# Patient Record
Sex: Female | Born: 1970 | Race: White | Hispanic: Yes | Marital: Single | Smoking: Never smoker
Health system: Southern US, Community
[De-identification: ages and names within clinical notes are randomized; demographics above are authoritative.]

## PROBLEM LIST (undated history)

## (undated) DIAGNOSIS — K219 Gastro-esophageal reflux disease without esophagitis: Secondary | ICD-10-CM

## (undated) DIAGNOSIS — J45909 Unspecified asthma, uncomplicated: Secondary | ICD-10-CM

## (undated) HISTORY — PX: KNEE SURGERY: SHX244

## (undated) HISTORY — DX: Gastro-esophageal reflux disease without esophagitis: K21.9

## (undated) HISTORY — DX: Unspecified asthma, uncomplicated: J45.909

## (undated) HISTORY — PX: OTHER SURGICAL HISTORY: SHX169

---

## 1997-06-07 ENCOUNTER — Other Ambulatory Visit: Admission: RE | Admit: 1997-06-07 | Discharge: 1997-06-07 | Payer: Self-pay | Admitting: *Deleted

## 2004-07-22 ENCOUNTER — Emergency Department (HOSPITAL_COMMUNITY): Admission: EM | Admit: 2004-07-22 | Discharge: 2004-07-22 | Payer: Self-pay | Admitting: Emergency Medicine

## 2005-07-09 ENCOUNTER — Ambulatory Visit: Payer: Self-pay | Admitting: Gynecology

## 2005-07-09 ENCOUNTER — Encounter (INDEPENDENT_AMBULATORY_CARE_PROVIDER_SITE_OTHER): Payer: Self-pay | Admitting: Gynecology

## 2005-08-27 ENCOUNTER — Ambulatory Visit: Payer: Self-pay | Admitting: Obstetrics and Gynecology

## 2005-10-13 ENCOUNTER — Ambulatory Visit (HOSPITAL_COMMUNITY): Admission: RE | Admit: 2005-10-13 | Discharge: 2005-10-13 | Payer: Self-pay | Admitting: Family Medicine

## 2005-10-13 ENCOUNTER — Ambulatory Visit: Payer: Self-pay | Admitting: Family Medicine

## 2005-10-30 ENCOUNTER — Ambulatory Visit: Payer: Self-pay | Admitting: Family Medicine

## 2005-11-04 ENCOUNTER — Ambulatory Visit (HOSPITAL_COMMUNITY): Admission: RE | Admit: 2005-11-04 | Discharge: 2005-11-04 | Payer: Self-pay | Admitting: Sports Medicine

## 2006-05-20 ENCOUNTER — Telehealth: Payer: Self-pay | Admitting: *Deleted

## 2006-05-20 ENCOUNTER — Encounter (INDEPENDENT_AMBULATORY_CARE_PROVIDER_SITE_OTHER): Payer: Self-pay | Admitting: Family Medicine

## 2006-05-20 ENCOUNTER — Ambulatory Visit: Payer: Self-pay | Admitting: Family Medicine

## 2006-05-20 LAB — CONVERTED CEMR LAB: Rapid Strep: NEGATIVE

## 2006-06-30 ENCOUNTER — Ambulatory Visit: Payer: Self-pay | Admitting: Family Medicine

## 2006-08-16 ENCOUNTER — Ambulatory Visit: Payer: Self-pay | Admitting: Sports Medicine

## 2006-09-17 ENCOUNTER — Ambulatory Visit: Payer: Self-pay | Admitting: Family Medicine

## 2006-10-20 ENCOUNTER — Encounter (INDEPENDENT_AMBULATORY_CARE_PROVIDER_SITE_OTHER): Payer: Self-pay | Admitting: Family Medicine

## 2006-10-20 ENCOUNTER — Ambulatory Visit: Payer: Self-pay | Admitting: Family Medicine

## 2006-10-20 DIAGNOSIS — K219 Gastro-esophageal reflux disease without esophagitis: Secondary | ICD-10-CM | POA: Insufficient documentation

## 2006-10-27 ENCOUNTER — Encounter (INDEPENDENT_AMBULATORY_CARE_PROVIDER_SITE_OTHER): Payer: Self-pay | Admitting: Family Medicine

## 2006-10-27 LAB — CONVERTED CEMR LAB: Pap Smear: NORMAL

## 2006-10-29 ENCOUNTER — Encounter (INDEPENDENT_AMBULATORY_CARE_PROVIDER_SITE_OTHER): Payer: Self-pay | Admitting: Family Medicine

## 2006-11-03 ENCOUNTER — Ambulatory Visit: Payer: Self-pay | Admitting: Family Medicine

## 2006-11-04 ENCOUNTER — Encounter (INDEPENDENT_AMBULATORY_CARE_PROVIDER_SITE_OTHER): Payer: Self-pay | Admitting: *Deleted

## 2006-11-16 ENCOUNTER — Encounter (INDEPENDENT_AMBULATORY_CARE_PROVIDER_SITE_OTHER): Payer: Self-pay | Admitting: Family Medicine

## 2006-11-16 ENCOUNTER — Ambulatory Visit: Payer: Self-pay | Admitting: Family Medicine

## 2006-11-16 LAB — CONVERTED CEMR LAB
Total CHOL/HDL Ratio: 3.5
Triglycerides: 91 mg/dL (ref <150)

## 2006-11-22 ENCOUNTER — Ambulatory Visit: Payer: Self-pay | Admitting: Family Medicine

## 2006-11-22 DIAGNOSIS — J45909 Unspecified asthma, uncomplicated: Secondary | ICD-10-CM | POA: Insufficient documentation

## 2007-01-13 ENCOUNTER — Ambulatory Visit: Payer: Self-pay | Admitting: Family Medicine

## 2007-01-17 ENCOUNTER — Ambulatory Visit: Payer: Self-pay | Admitting: Family Medicine

## 2007-04-12 ENCOUNTER — Ambulatory Visit: Payer: Self-pay | Admitting: Family Medicine

## 2007-04-12 ENCOUNTER — Encounter (INDEPENDENT_AMBULATORY_CARE_PROVIDER_SITE_OTHER): Payer: Self-pay | Admitting: *Deleted

## 2007-07-27 ENCOUNTER — Telehealth: Payer: Self-pay | Admitting: *Deleted

## 2007-07-29 ENCOUNTER — Ambulatory Visit: Payer: Self-pay | Admitting: Family Medicine

## 2007-07-29 ENCOUNTER — Telehealth: Payer: Self-pay | Admitting: *Deleted

## 2007-09-01 ENCOUNTER — Ambulatory Visit: Payer: Self-pay | Admitting: Family Medicine

## 2007-09-01 ENCOUNTER — Encounter: Payer: Self-pay | Admitting: Family Medicine

## 2007-09-01 LAB — CONVERTED CEMR LAB
ALT: 21 units/L (ref 0–35)
AST: 17 units/L (ref 0–37)
BUN: 10 mg/dL (ref 6–23)
Basophils Absolute: 0 10*3/uL (ref 0.0–0.1)
Basophils Relative: 1 % (ref 0–1)
Chloride: 105 meq/L (ref 96–112)
Creatinine, Ser: 0.6 mg/dL (ref 0.40–1.20)
Eosinophils Absolute: 0.2 10*3/uL (ref 0.0–0.7)
Eosinophils Relative: 4 % (ref 0–5)
Glucose, Bld: 106 mg/dL — ABNORMAL HIGH (ref 70–99)
Lymphocytes Relative: 40 % (ref 12–46)
MCV: 90.3 fL (ref 78.0–100.0)
Monocytes Absolute: 0.5 10*3/uL (ref 0.1–1.0)
Potassium: 3.9 meq/L (ref 3.5–5.3)
RBC: 4.34 M/uL (ref 3.87–5.11)
Sed Rate: 15 mm/hr (ref 0–22)
Sodium: 141 meq/L (ref 135–145)
TSH: 1.969 microintl units/mL (ref 0.350–4.50)
Total Bilirubin: 0.3 mg/dL (ref 0.3–1.2)
WBC: 5.8 10*3/uL (ref 4.0–10.5)

## 2007-09-28 ENCOUNTER — Ambulatory Visit: Payer: Self-pay | Admitting: Family Medicine

## 2007-10-07 ENCOUNTER — Encounter: Payer: Self-pay | Admitting: Family Medicine

## 2007-11-15 ENCOUNTER — Ambulatory Visit: Payer: Self-pay | Admitting: Family Medicine

## 2007-11-15 ENCOUNTER — Telehealth (INDEPENDENT_AMBULATORY_CARE_PROVIDER_SITE_OTHER): Payer: Self-pay | Admitting: *Deleted

## 2007-11-17 ENCOUNTER — Telehealth: Payer: Self-pay | Admitting: *Deleted

## 2007-11-30 ENCOUNTER — Ambulatory Visit: Payer: Self-pay | Admitting: Family Medicine

## 2008-02-17 ENCOUNTER — Ambulatory Visit: Payer: Self-pay | Admitting: Family Medicine

## 2008-02-17 DIAGNOSIS — G4700 Insomnia, unspecified: Secondary | ICD-10-CM | POA: Insufficient documentation

## 2008-07-27 ENCOUNTER — Ambulatory Visit: Payer: Self-pay | Admitting: Family Medicine

## 2008-08-27 ENCOUNTER — Emergency Department (HOSPITAL_COMMUNITY): Admission: EM | Admit: 2008-08-27 | Discharge: 2008-08-27 | Payer: Self-pay | Admitting: Emergency Medicine

## 2008-10-05 ENCOUNTER — Emergency Department (HOSPITAL_COMMUNITY): Admission: EM | Admit: 2008-10-05 | Discharge: 2008-10-05 | Payer: Self-pay | Admitting: Family Medicine

## 2008-10-26 ENCOUNTER — Encounter: Payer: Self-pay | Admitting: Family Medicine

## 2008-10-26 ENCOUNTER — Ambulatory Visit: Payer: Self-pay | Admitting: Family Medicine

## 2008-10-29 ENCOUNTER — Ambulatory Visit: Payer: Self-pay | Admitting: Family Medicine

## 2008-11-12 ENCOUNTER — Emergency Department (HOSPITAL_COMMUNITY): Admission: EM | Admit: 2008-11-12 | Discharge: 2008-11-12 | Payer: Self-pay | Admitting: Emergency Medicine

## 2008-11-23 ENCOUNTER — Ambulatory Visit: Payer: Self-pay | Admitting: Family Medicine

## 2009-01-24 ENCOUNTER — Telehealth: Payer: Self-pay | Admitting: Family Medicine

## 2009-01-28 ENCOUNTER — Ambulatory Visit: Payer: Self-pay | Admitting: Family Medicine

## 2009-01-28 LAB — CONVERTED CEMR LAB: Beta hcg, urine, semiquantitative: NEGATIVE

## 2009-05-24 ENCOUNTER — Ambulatory Visit: Payer: Self-pay | Admitting: Family Medicine

## 2009-05-24 LAB — CONVERTED CEMR LAB: Beta hcg, urine, semiquantitative: NEGATIVE

## 2009-07-02 ENCOUNTER — Ambulatory Visit: Payer: Self-pay | Admitting: Family Medicine

## 2009-07-12 ENCOUNTER — Encounter: Payer: Self-pay | Admitting: Family Medicine

## 2009-07-12 ENCOUNTER — Ambulatory Visit: Payer: Self-pay | Admitting: Family Medicine

## 2009-08-02 ENCOUNTER — Emergency Department (HOSPITAL_COMMUNITY): Admission: EM | Admit: 2009-08-02 | Discharge: 2009-08-02 | Payer: Self-pay | Admitting: Emergency Medicine

## 2009-08-02 ENCOUNTER — Emergency Department (HOSPITAL_COMMUNITY): Admission: EM | Admit: 2009-08-02 | Discharge: 2009-08-03 | Payer: Self-pay | Admitting: Emergency Medicine

## 2009-08-23 ENCOUNTER — Ambulatory Visit: Payer: Self-pay | Admitting: Family Medicine

## 2009-08-27 ENCOUNTER — Telehealth: Payer: Self-pay | Admitting: *Deleted

## 2009-09-04 ENCOUNTER — Ambulatory Visit (HOSPITAL_COMMUNITY): Admission: RE | Admit: 2009-09-04 | Discharge: 2009-09-04 | Payer: Self-pay | Admitting: Family Medicine

## 2009-09-06 ENCOUNTER — Ambulatory Visit: Payer: Self-pay | Admitting: Family Medicine

## 2009-09-10 ENCOUNTER — Encounter: Payer: Self-pay | Admitting: Family Medicine

## 2009-09-10 ENCOUNTER — Ambulatory Visit: Payer: Self-pay | Admitting: Family Medicine

## 2009-09-11 LAB — CONVERTED CEMR LAB
ALT: 10 units/L (ref 0–35)
Albumin: 4.3 g/dL (ref 3.5–5.2)
Alkaline Phosphatase: 45 units/L (ref 39–117)
Basophils Relative: 1 % (ref 0–1)
CO2: 26 meq/L (ref 19–32)
Calcium: 8.9 mg/dL (ref 8.4–10.5)
Chloride: 104 meq/L (ref 96–112)
Cholesterol: 172 mg/dL (ref 0–200)
Eosinophils Relative: 3 % (ref 0–5)
HDL: 50 mg/dL (ref >39)
LDL Cholesterol: 110 mg/dL — ABNORMAL HIGH (ref 0–99)
MCHC: 32 g/dL (ref 30.0–36.0)
Platelets: 183 10*3/uL (ref 150–400)
Potassium: 4 meq/L (ref 3.5–5.3)
RBC: 4.3 M/uL (ref 3.87–5.11)
RDW: 13.9 % (ref 11.5–15.5)
TSH: 2.46 microintl units/mL (ref 0.350–4.500)
Total CHOL/HDL Ratio: 3.4
Total Protein: 7.3 g/dL (ref 6.0–8.3)
Triglycerides: 61 mg/dL (ref <150)
WBC: 4.4 10*3/uL (ref 4.0–10.5)

## 2009-09-27 ENCOUNTER — Ambulatory Visit: Payer: Self-pay | Admitting: Family Medicine

## 2009-09-27 DIAGNOSIS — G43009 Migraine without aura, not intractable, without status migrainosus: Secondary | ICD-10-CM | POA: Insufficient documentation

## 2009-10-11 ENCOUNTER — Telehealth: Payer: Self-pay | Admitting: *Deleted

## 2009-10-20 ENCOUNTER — Encounter: Payer: Self-pay | Admitting: Family Medicine

## 2009-10-21 ENCOUNTER — Ambulatory Visit: Payer: Self-pay | Admitting: Family Medicine

## 2009-10-24 ENCOUNTER — Ambulatory Visit (HOSPITAL_COMMUNITY): Admission: RE | Admit: 2009-10-24 | Discharge: 2009-10-24 | Payer: Self-pay | Admitting: Family Medicine

## 2009-10-25 ENCOUNTER — Telehealth: Payer: Self-pay | Admitting: Family Medicine

## 2009-10-28 ENCOUNTER — Ambulatory Visit: Payer: Self-pay | Admitting: Family Medicine

## 2009-11-19 ENCOUNTER — Ambulatory Visit: Payer: Self-pay | Admitting: Family Medicine

## 2009-11-19 DIAGNOSIS — M25569 Pain in unspecified knee: Secondary | ICD-10-CM | POA: Insufficient documentation

## 2009-12-05 ENCOUNTER — Ambulatory Visit: Payer: Self-pay | Admitting: Family Medicine

## 2009-12-05 ENCOUNTER — Encounter: Payer: Self-pay | Admitting: Family Medicine

## 2009-12-10 ENCOUNTER — Ambulatory Visit: Payer: Self-pay | Admitting: Family Medicine

## 2009-12-16 ENCOUNTER — Ambulatory Visit: Payer: Self-pay | Admitting: Family Medicine

## 2010-01-03 ENCOUNTER — Ambulatory Visit: Admission: RE | Admit: 2010-01-03 | Discharge: 2010-01-03 | Payer: Self-pay | Source: Home / Self Care

## 2010-02-04 NOTE — Assessment & Plan Note (Signed)
Summary: IUD/KH   Vital Signs:  Patient profile:   40 year old female Height:      63 inches Weight:      148 pounds BMI:     26.31 Temp:     98.0 degrees F oral Pulse rate:   70 / minute BP sitting:   115 / 73  (right arm) Cuff size:   regular  Vitals Entered By: Tessie Fass CMA (July 12, 2009 10:31 AM)  Primary Care Provider:  Ardeen Garland  MD  CC:  IUD.  History of Present Illness:   Pt here for IUD insertion  Upreg negative Has had Mirena in past  Procedure explained  Current Medications (verified): 1)  Albuterol 90 Mcg/act  Aers (Albuterol) .... 2 Puffs Q 4 Hours As Needed For Asthma 2)  Loratadine 10 Mg  Tabs (Loratadine) .... One Tab By Mouth Daily. Please Print Label in Spanish 3)  Famotidine 20 Mg  Tabs (Famotidine) .... One Tab By Mouth Daily. Please Print Label in Spanish 4)  Lactulose 10 Gm/108ml Soln (Lactulose) .... 30 Ml Daily, Qs For One Month 5)  Amitriptyline Hcl 25 Mg Tabs (Amitriptyline Hcl) .Marland Kitchen.. 1 Tablet By Mouth Qhs For Sleep Label in Spanish, Please 6)  Cyclobenzaprine Hcl 5 Mg Tabs (Cyclobenzaprine Hcl) .Marland Kitchen.. 1 Tab By Mouth Q 8 Hrs As Needed Muscle Pain or Spasm. Please Label in Spanish  Allergies (verified): 1)  ! Sprintec 28 (Norgestimate-Eth Estradiol)  Physical Exam  Genitalia:  normal introitus, no external lesions, no vaginal discharge, mucosa pink and moist, and no vaginal or cervical lesions.  uterus palpated  Additional Exam:  Procedure- risks, benefits and procedure explained, consent form obtained and signs, all questions answered Prepped with Betadine Uterus Sounded to 8 Mirena IUD inserted with ease  Strings cut to 1cm minimal blood loss pt tolerated procedure well Ibuprofen given s/p procedure Attending- Dr. McDiarmid   Impression & Recommendations:  Problem # 1:  CONTRACEPTIVE MANAGEMENT (ICD-V25.09) Assessment New Mirena, RTC 1 month for follow-up  See instructions Orders: U Preg-[facility] (09811) IUD Supply-[facility]  (B1478) IUD insert- [facility] (29562) Ibuprofen 200mg  tab [facility]) IUD insert- [facility] (13086)  Complete Medication List: 1)  Albuterol 90 Mcg/act Aers (Albuterol) .... 2 puffs q 4 hours as needed for asthma 2)  Loratadine 10 Mg Tabs (Loratadine) .... One tab by mouth daily. please print label in spanish 3)  Famotidine 20 Mg Tabs (Famotidine) .... One tab by mouth daily. please print label in spanish 4)  Lactulose 10 Gm/65ml Soln (Lactulose) .... 30 ml daily, qs for one month 5)  Amitriptyline Hcl 25 Mg Tabs (Amitriptyline hcl) .Marland Kitchen.. 1 tablet by mouth qhs for sleep label in spanish, please 6)  Cyclobenzaprine Hcl 5 Mg Tabs (Cyclobenzaprine hcl) .Marland Kitchen.. 1 tab by mouth q 8 hrs as needed muscle pain or spasm. please label in spanish  Patient Instructions: 1)  You may have some cramping, ibuprofen as needed for pain  2)  Return in 1 month for recheck on IUD  3)  Wear a pad for spotting or light bleeding  Laboratory Results   Urine Tests  Date/Time Received: July 12, 2009 10:32 AM  Date/Time Reported: July 12, 2009 10:38 AM     Urine HCG: negative Comments: ...........test performed by...........Marland KitchenTerese Door, CMA       Medication Administration  Medication # 1:    Medication: Ibuprofen 200mg  tab    Diagnosis: CONTRACEPTIVE MANAGEMENT (ICD-V25.09)    Dose: 4 tablets  Route: po    Exp Date: 10/06/2010    Lot #: [id number]    Mfr: LNK    Comments: 800mg  given (4 tabs 200mg  each)    Given by: Jimmy Footman, CMA (July 12, 2009 11:17 AM)  Orders Added: 1)  U Preg-[facility] [81025] 2)  IUD Supply-[facility] [J7302] 3)  IUD insert- [facility] [58300] 4)  Ibuprofen 200mg  tab [EMRORAL] 5)  IUD insert- [facility] [58300]

## 2010-02-04 NOTE — Assessment & Plan Note (Signed)
Summary: F/U KNEE/DR TA NOT AVAIL/KH   Vital Signs:  Patient profile:   40 year old female Height:      61.75 inches Weight:      152 pounds BMI:     28.13 Temp:     98.7 degrees F oral Pulse rate:   71 / minute BP sitting:   107 / 70  (right arm) Cuff size:   regular  Vitals Entered By: Tessie Fass CMA (October 28, 2009 8:48 AM) CC: left knee pain Pain Assessment Patient in pain? yes     Location: left knee   Primary Care Provider:  Cat Ta MD  CC:  left knee pain.  History of Present Illness:   Left knee- initially pain in one spot now radiating to the sides, feels pain is getting worse, now pain with bending knee, touching knee, or any movements, mild swelling, +pain with walking, taking Voltaren ( 2 more pills ) and Ultram does not take because it makes her sleepy , takes  1/2 tablet at night, currently wearing knee brace, still out of work . Out of work for 1 week, plans to return on Wed ,no locking or catching Told she would be sent to [facility] by prior physician  Current Medications (verified): 1)  Albuterol 90 Mcg/act  Aers (Albuterol) .... 2 Puffs Q 4 Hours As Needed For Asthma 2)  Loratadine 10 Mg  Tabs (Loratadine) .... One Tab By Mouth Daily. Please Print Label in Spanish 3)  Famotidine 20 Mg  Tabs (Famotidine) .... One Tab By Mouth Daily. Please Print Label in Spanish 4)  Lactulose 10 Gm/28ml Soln (Lactulose) .... 30 Ml Daily, Qs For One Month 5)  Amitriptyline Hcl 25 Mg Tabs (Amitriptyline Hcl) .Marland Kitchen.. 1 Tablet By Mouth Qhs For Sleep Label in Spanish, Please 6)  Imitrex 25 Mg Tabs (Sumatriptan Succinate) .Marland Kitchen.. 1 Tab By Mouth X 1 When You Have Headache.  May Repeat X 1 After 2 Hours If Headache Recurs.  Lable in Spanish. 7)  Mobic 7.5 Mg Tabs (Meloxicam) .Marland Kitchen.. 1 By Mouth Daily For Knee Inflammation 8)  Ultram 50 Mg Tabs (Tramadol Hcl) .Marland Kitchen.. 1 By Mouth Q 6hrs As Needed Pain  Allergies (verified): 1)  ! Sprintec 28 (Norgestimate-Eth Estradiol)  Physical Exam  General:   Vitals reviewed.  NAD Msk:  Left knee: Wearing Brace, No swelling or effusion noted, mild pain with varus testing, normal ROM, no crepitus, no external bruising noted. Patella tracts normally, TTP over inferior joint line and lateral joint line.   Negative lachmans.    Right knee: normal appearing.  No swelling.  normal ROM   Impression & Recommendations:  Problem # 1:  KNEE PAIN, LEFT (ICD-719.46) Assessment Deteriorated Persistant knee pain, no red flags on exam, normal x-ray, likley brusining, tendons appear intact, will continue with anti-inflammatories, Ultram for pain, refer to [facility] at [facility], brace. 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  Her updated medication list for this problem includes:    Mobic 7.5 Mg Tabs (Meloxicam) .Marland Kitchen... 1 by mouth daily for knee inflammation    Ultram 50 Mg Tabs (Tramadol hcl) .Marland Kitchen... 1 by mouth q 6hrs as needed pain  Orders: Sports Medicine (Sports Med) [facility]- Est Level  3 (32440)  Complete Medication List: 1)  Albuterol 90 Mcg/act Aers (Albuterol) .... 2 puffs q 4 hours as needed for asthma 2)  Loratadine 10 Mg Tabs (Loratadine) .... One tab by mouth daily. please print label in  spanish 3)  Famotidine 20 Mg Tabs (Famotidine) .... One tab by mouth daily. please print label in spanish 4)  Lactulose 10 Gm/84ml Soln (Lactulose) .... 30 ml daily, qs for one month 5)  Amitriptyline Hcl 25 Mg Tabs (Amitriptyline hcl) .Marland Kitchen.. 1 tablet by mouth qhs for sleep label in spanish, please 6)  Imitrex 25 Mg Tabs (Sumatriptan succinate) .Marland Kitchen.. 1 tab by mouth x 1 when you have headache.  may repeat x 1 after 2 hours if headache recurs.  lable in spanish. 7)  Mobic 7.5 Mg Tabs (Meloxicam) .Marland Kitchen.. 1 by mouth daily for knee inflammation 8)  Ultram 50 Mg Tabs (Tramadol hcl) .Marland Kitchen.. 1 by mouth q 6hrs as needed pain  Patient Instructions: 1)  For your knee we will send you to the Sports Medicine Clinic 2)  Continue to take the  anti-inflammatory, start the Mobic once a day for 3 weeks 3)  You can use the ultram as needed 4)  If you notice swelling in your knee return for re-evaluation 5)  Wear your brace while at work Prescriptions: FAMOTIDINE 20 MG  TABS (FAMOTIDINE) one tab by mouth daily. please print label in spanish  #30 x 6   Entered and Authorized by:   Milinda Antis MD   Signed by:   Milinda Antis MD on 10/28/2009   Method used:   Print then Give to Patient   RxID:   [id number] MOBIC 7.5 MG TABS (MELOXICAM) 1 by mouth daily for knee inflammation  #21 x 0   Entered and Authorized by:   Milinda Antis MD   Signed by:   Milinda Antis MD on 10/28/2009   Method used:   Print then Give to Patient   RxID:   [id number]    Orders Added: 1)  Sports Medicine [Sports Med] 2)  [facility]- Est Level  3 [16606]

## 2010-02-04 NOTE — Miscellaneous (Signed)
Summary: Asthma  Clinical Lists Changes  Problems: Removed problem of SCREENING FOR LIPOID DISORDERS (ICD-V77.91)

## 2010-02-04 NOTE — Letter (Signed)
Summary: Out of Work  [facility]  [address]   [place], [place] [address]   Phone: [phone]  Fax: [phone]    December 05, 2009   Employee:  ANIEYA HELMAN    To Whom It May Concern:   For Medical reasons, please excuse the above named employee from work for the following dates:  Start:   12/05/09  End:   12/06/09 or longer if desired.   If you need additional information, please feel free to contact our office.         Sincerely,    Clementeen Graham MD

## 2010-02-04 NOTE — Progress Notes (Signed)
Summary: re: [place] at [facility]  Phone Note Call from Patient Call back at [phone]   Caller: Sister-Mariel Summary of Call: got called into work and cannot go to [place] appt today - needs to resch (sister is coming in today at 11:15 to see Mauricio Po - she will talk to nurse about it) Initial call taken by: De Nurse,  August 27, 2009 9:32 AM  Follow-up for Phone Call        called pt. spoke with [facility].  Advised to call [facility] and cancel the appt for today, re-schedule the appt. She agreed and will do that. Follow-up by: Arlyss Repress CMA,,  August 27, 2009 9:48 AM

## 2010-02-04 NOTE — Miscellaneous (Signed)
Summary: Consent: IUD Insertion  Consent: IUD Insertion   Imported By: Knox Royalty 07/27/2009 13:30:39  _____________________________________________________________________  External Attachment:    Type:   Image     Comment:   External Document

## 2010-02-04 NOTE — Assessment & Plan Note (Signed)
Summary: preg test,df   Vital Signs:  Patient profile:   40 year old female Height:      63 inches Weight:      145 pounds BMI:     25.78 Temp:     98.5 degrees F oral Pulse rate:   72 / minute BP sitting:   106 / 67  (left arm) Cuff size:   large  Vitals Entered By: Tessie Fass CMA (May 24, 2009 9:52 AM) CC: preg test Is Patient Diabetic? No Pain Assessment Patient in pain? yes     Location: back Intensity: 9   Primary Care Provider:  Ardeen Garland  MD  CC:  preg test.  History of Present Illness: Tracey Kane comes in for pregnancy test and birth control.  Seen in January and IUD was removed as it had expired.  She completed [employer] paperwork to receive another and opted for depoprovera in the meantime.  States had 1 menses after that but then has not had one since and was worried she was pregnant.  She never heard on the IUD.  Checked with staff and she was approved for the IUD but we have not received it yet.  States she would like the pills instead to use until the IUD comes in.   Habits & Providers  Alcohol-Tobacco-Diet     Tobacco Status: never  Allergies: No Known Drug Allergies  Physical Exam  General:  well-developed, well-nourished, and well-hydrated.  vitals reviewed.  Heart:  Normal rate and regular rhythm. S1 and S2 normal without gallop, murmur, click, rub or other extra sounds. Abdomen:  Bowel sounds positive,abdomen soft and non-tender without masses, organomegaly or hernias noted.   Impression & Recommendations:  Problem # 1:  CONTRACEPTIVE MANAGEMENT (ICD-V25.09) Assessment Unchanged  Staff will check on IUD situation.  Sprintec in the meantime.  Reminded patient to schedule insertion with a provider who inserts them.   Orders: [facility]- Est Level  3 (74259)  Complete Medication List: 1)  Albuterol 90 Mcg/act Aers (Albuterol) .... 2 puffs q 4 hours as needed for asthma 2)  Loratadine 10 Mg Tabs (Loratadine) .... One tab by mouth daily. please  print label in spanish 3)  Famotidine 20 Mg Tabs (Famotidine) .... One tab by mouth daily. please print label in spanish 4)  Lactulose 10 Gm/53ml Soln (Lactulose) .... 30 ml daily, qs for one month 5)  Guiatuss Ac 100-10 Mg/71ml Syrp (Guaifenesin-codeine) 6)  Amitriptyline Hcl 25 Mg Tabs (Amitriptyline hcl) .Marland Kitchen.. 1 tablet by mouth qhs for sleep label in spanish, please 7)  Cyclobenzaprine Hcl 5 Mg Tabs (Cyclobenzaprine hcl) .Marland Kitchen.. 1 tab by mouth q 8 hrs as needed muscle pain or spasm. please label in spanish 8)  Sprintec 28 0.25-35 Mg-mcg Tabs (Norgestimate-eth estradiol) .... Take one tablet daily at same time everyday.  label in spanish. disp: 3 months  Other Orders: U Preg-[facility] (56387)  Patient Instructions: 1)  Vamos a llamarle cuando recibimos su IUD. 2)  Toma las pastillas al mismo tiempo cada dia hasta recibe el IUD.   3)  Cuando hace su cita para insertar el IUD, hace con Dr. Jeanice Lim.  Yo no inserto los IUDs.   Prescriptions: ALBUTEROL 90 MCG/ACT  AERS (ALBUTEROL) 2 puffs q 4 hours as needed for asthma  #1 x 11   Entered and Authorized by:   Ardeen Garland  MD   Signed by:   Ardeen Garland  MD on 05/24/2009   Method used:   Print then Give to Patient  RxID:   [id number] SPRINTEC 28 0.25-35 MG-MCG TABS (NORGESTIMATE-ETH ESTRADIOL) take one tablet daily at same time everyday.  Label in spanish. disp: 3 months  #3 x 3   Entered and Authorized by:   Ardeen Garland  MD   Signed by:   Ardeen Garland  MD on 05/24/2009   Method used:   Print then Give to Patient   RxID:   [id number]   Laboratory Results   Urine Tests  Date/Time Received: May 24, 2009 10:34 AM  Date/Time Reported: May 24, 2009 10:44 AM     Urine HCG: negative Comments: ...........test performed by...........Marland KitchenTerese Door, CMA

## 2010-02-04 NOTE — Assessment & Plan Note (Signed)
Summary: F/U,MC   Vital Signs:  Patient profile:   40 year old female Pulse rate:   84 / minute BP sitting:   120 / 79  (right arm)  Vitals Entered By: Rochele Pages RN (December 10, 2009 9:08 AM) CC: f/u lt knee pain   Chief Complaint:  f/u lt knee pain.  History of Present Illness: Pt reports minimal improvement in knee pain since most recent clinic visit. Steroid injection helped with pain and ROM for approx 2 days per pt (Daughter translating for pt).  Ultram has been somewhat effective for pain; usually helps with pain at night.  Has been attending physcial therapy; feels that has only increased pain and weakness. baseline wekaness and anteriolateral knee pain essentially inchanged per pt. has been compliant in wearing brace which has helped somewhat with overall stability.   Preventive Screening-Counseling & Management  Alcohol-Tobacco     Smoking Status: never  Allergies: 1)  ! Sprintec 28 (Norgestimate-Eth Estradiol)  Physical Exam  General:  alert and overweight-appearing.   Msk:  Knee: Normal to inspection with no erythema or effusion or obvious bony abnormalities. +TTP along pateller tendon  Decreased ROM and pain w/ flexion, extension and lower leg rotation. + tenderness to palpation with ant/post movement of knee joint  grossly + mcmurray's Decreased hamstring and quadriceps strength Decreased strength with hip abduction bilaterally    Impression & Recommendations:  Problem # 1:  KNEE PAIN, LEFT (ICD-719.46)   U/S non concering for patellar tendon tear.  Patellar strap given to pt for patellar support. Stressed with pt importance of PT for strenghtening of knee as to help in knee weakness and stability. Her updated medication list for this problem includes:    Ultram 50 Mg Tabs (Tramadol hcl) .Marland Kitchen... 1 by mouth q 6hrs as needed pain    Tylenol With Codeine #3 300-30 Mg Tabs (Acetaminophen-codeine) .Marland Kitchen... 1 pill by mouth at night for cough  Orders: [place] LIMITED  (65784)  Complete Medication List: 1)  Albuterol 90 Mcg/act Aers (Albuterol) .... 2 puffs q 4 hours as needed for asthma 2)  Loratadine 10 Mg Tabs (Loratadine) .... One tab by mouth daily. please print label in spanish 3)  Famotidine 20 Mg Tabs (Famotidine) .... One tab by mouth daily. please print label in spanish 4)  Lactulose 10 Gm/32ml Soln (Lactulose) .... 30 ml daily, qs for one month 5)  Amitriptyline Hcl 25 Mg Tabs (Amitriptyline hcl) .Marland Kitchen.. 1 tablet by mouth qhs for sleep label in spanish, please 6)  Imitrex 25 Mg Tabs (Sumatriptan succinate) .Marland Kitchen.. 1 tab by mouth x 1 when you have headache.  may repeat x 1 after 2 hours if headache recurs.  lable in spanish. 7)  Ultram 50 Mg Tabs (Tramadol hcl) .Marland Kitchen.. 1 by mouth q 6hrs as needed pain 8)  Azithromycin 250 Mg Tabs (Azithromycin) .... 2 pills by mouth daily and 1 pill daily days 2-5. spanish instructions 9)  Tylenol With Codeine #3 300-30 Mg Tabs (Acetaminophen-codeine) .Marland Kitchen.. 1 pill by mouth at night for cough   Orders Added: 1)  Est. Patient Level III [69629] 2)  [place] LIMITED [52841]
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
## 2010-02-04 NOTE — Assessment & Plan Note (Signed)
Summary: vag prob,df   Vital Signs:  Patient profile:   40 year old female Height:      63 inches Weight:      146 pounds BMI:     25.96 Temp:     98.0 degrees F oral Pulse rate:   81 / minute BP sitting:   113 / 65  (left arm) Cuff size:   regular  Vitals Entered By: Tessie Fass, CMA CC: check iud Is Patient Diabetic? No Pain Assessment Patient in pain? no        Primary Care Provider:  Ardeen Garland  MD  CC:  check iud.  History of Present Illness: Ms. Genia Hotter comes in today because she believes her IUD is expired.  She had it placed after the birth of her youngest child, who was born September 2005.  It is a [employer] fast streamer.  She did not have it placed here, but per her records at the time she was first seen here, it does indeed correspond to her mirena being placed in late 2005 (seen in 2007, with reference to mirena placed 2 years prior).  Endorses LMP as end of December.  Would like to have another mirena.    Habits & Providers  Alcohol-Tobacco-Diet     Tobacco Status: never  Allergies: No Known Drug Allergies  Physical Exam  General:  well-developed, well-nourished, and well-hydrated.   Abdomen:  soft, nt, nd Genitalia:  normal introitus, no external lesions, no vaginal discharge, mucosa pink and moist, and no vaginal or cervical lesions.  Strings of IUD difficult to visualize.  Pap endocervical brush used to identify/isolate strings and IUD removed with forceps without difficulty.    Impression & Recommendations:  Problem # 1:  CONTRACEPTIVE MANAGEMENT (ICD-V25.09) Assessment New Expired IUD removed.  Patient completed paperwork for Mirena IUD through [facility].  Given depo provera today for contraception while awaiting new IUD.  When IUD is in, patient will schedule appt with Dr. Jeanice Lim for insertion.  Orders: U Preg-[facility] (81025) [facility]- Est Level  3 (47829) IUD removal -[facility] (56213) Depo-Provera 150mg  (Y8657)  Complete Medication List: 1)  Albuterol 90  Mcg/act Aers (Albuterol) .... 2 puffs q 4 hours as needed for asthma 2)  Loratadine 10 Mg Tabs (Loratadine) .... One tab by mouth daily. please print label in spanish 3)  Famotidine 20 Mg Tabs (Famotidine) .... One tab by mouth daily. please print label in spanish 4)  Lactulose 10 Gm/42ml Soln (Lactulose) .... 30 ml daily, qs for one month 5)  Guiatuss Ac 100-10 Mg/36ml Syrp (Guaifenesin-codeine) 6)  Amitriptyline Hcl 25 Mg Tabs (Amitriptyline hcl) .Marland Kitchen.. 1 tablet by mouth qhs for sleep label in spanish, please 7)  Cyclobenzaprine Hcl 5 Mg Tabs (Cyclobenzaprine hcl) .Marland Kitchen.. 1 tab by mouth q 8 hrs as needed muscle pain or spasm. please label in spanish  Patient Instructions: 1)  Hemos quitado su Mirena hoy. 2)  Ud recibe la inyeccion de Depo para una anti-conceptivo. 3)  Cuando nosotros recibimos su IUD, vamos a llamarle.  Despues de esto, hace una cita con DR. KAWANTA [place] para ponerse el IUD.  4)  Si tiene preguntas, llamanos. Prescriptions: CYCLOBENZAPRINE HCL 5 MG TABS (CYCLOBENZAPRINE HCL) 1 tab by mouth q 8 hrs as needed muscle pain or spasm. Please label in spanish  #45 x 3   Entered and Authorized by:   Ardeen Garland  MD   Signed by:   Ardeen Garland  MD on 01/28/2009   Method used:   Print then  Give to Patient   RxID:   [id number] FAMOTIDINE 20 MG  TABS (FAMOTIDINE) one tab by mouth daily. please print label in spanish  #30 x 11   Entered and Authorized by:   Ardeen Garland  MD   Signed by:   Ardeen Garland  MD on 01/28/2009   Method used:   Print then Give to Patient   RxID:   [id number] LORATADINE 10 MG  TABS (LORATADINE) one tab by mouth daily. please print label in spanish  #30 x 11   Entered and Authorized by:   Ardeen Garland  MD   Signed by:   Ardeen Garland  MD on 01/28/2009   Method used:   Print then Give to Patient   RxID:   [id number] AMITRIPTYLINE HCL 25 MG TABS (AMITRIPTYLINE HCL) 1 tablet by mouth qHS for sleep label in spanish, please  #30 x 6   Entered and  Authorized by:   Ardeen Garland  MD   Signed by:   Ardeen Garland  MD on 01/28/2009   Method used:   Print then Give to Patient   RxID:   [id number]   Laboratory Results   Urine Tests  Date/Time Received: January 28, 2009 11:00 AM  Date/Time Reported: January 28, 2009 11:04 AM     Urine HCG: negative Comments: ...............test performed by......Marland KitchenBonnie A. [place], MLS (ASCP)cm      Medication Administration  Injection # 1:    Medication: Depo-Provera 150mg     Diagnosis: CONTRACEPTIVE MANAGEMENT (ICD-V25.09)    Route: IM    Site: RUOQ gluteus    Exp Date: 04/06/2011    Lot #: [id number]    Mfr: greenstone    Comments: NEXT DEPO DUE APRIL 11 THROUGH 25, 2011    Patient tolerated injection without complications    Given by: Arlyss Repress CMA, (January 28, 2009 11:40 AM)  Orders Added: 1)  U Preg-[facility] [81025] 2)  [place]- Est Level  3 [51884] 3)  IUD removal -[facility] [58301] 4)  Depo-Provera 150mg  [J1055]

## 2010-02-04 NOTE — Assessment & Plan Note (Signed)
Summary: change BCP,df   Vital Signs:  Patient profile:   40 year old female Height:      63 inches Weight:      147 pounds BMI:     26.13 Temp:     98.1 degrees F oral Pulse rate:   71 / minute BP sitting:   116 / 69  (left arm) Cuff size:   regular  Vitals Entered By: Tessie Fass CMA (July 02, 2009 10:45 AM) CC: change BC   Primary Care Provider:  Ardeen Garland  MD  CC:  change BC.  History of Present Illness: [name] comes in today to discuss birth control.  Her IUD was removed in January because it was expired.  She completed paperwork [facility] [employer] [facility] to get a free mirena and received depo in the meantime.  She  never heard on the Mirena.  Last appt we checked and she had been approved but it hadn't been shipped yet.  It is now here.  However today she came in saying now she thinks she would like the implanon.  Side effects were discussed, including irregular and unpredictable spotting/bleeding.  More so than with the mirena. Also, mirena good for 5 years veruss 3 for the implanon.    Allergies: No Known Drug Allergies  Physical Exam  General:  well-developed, well-nourished, and well-hydrated.  vitals reviewed.    Impression & Recommendations:  Problem # 1:  CONTRACEPTIVE MANAGEMENT (ICD-V25.09) Assessment Unchanged  After discussion, patient decidd she would go with the mirena.  SHe will make appt to have this put in with Dr. Jeanice Lim at her earliest convenience.   Orders: [facility]- Est Level  3 (16109)  Complete Medication List: 1)  Albuterol 90 Mcg/act Aers (Albuterol) .... 2 puffs q 4 hours as needed for asthma 2)  Loratadine 10 Mg Tabs (Loratadine) .... One tab by mouth daily. please print label in spanish 3)  Famotidine 20 Mg Tabs (Famotidine) .... One tab by mouth daily. please print label in spanish 4)  Lactulose 10 Gm/17ml Soln (Lactulose) .... 30 ml daily, qs for one month 5)  Guiatuss Ac 100-10 Mg/74ml Syrp (Guaifenesin-codeine) 6)  Amitriptyline  Hcl 25 Mg Tabs (Amitriptyline hcl) .Marland Kitchen.. 1 tablet by mouth qhs for sleep label in spanish, please 7)  Cyclobenzaprine Hcl 5 Mg Tabs (Cyclobenzaprine hcl) .Marland Kitchen.. 1 tab by mouth q 8 hrs as needed muscle pain or spasm. please label in spanish 8)  Sprintec 28 0.25-35 Mg-mcg Tabs (Norgestimate-eth estradiol) .... Take one tablet daily at same time everyday.  label in spanish. disp: 3 months  Patient Instructions: 1)  Hace una cita con Dr. Jeanice Lim para ponerse su mirena. 2)  Make an appointment with Dr. Jeanice Lim to have your Mirena inserted.

## 2010-02-04 NOTE — Assessment & Plan Note (Signed)
Summary: f/u US.df   Vital Signs:  Patient profile:   40 year old female Height:      61.75 inches Weight:      149.2 pounds BMI:     27.61 Temp:     98.6 degrees F Pulse rate:   68 / minute BP sitting:   110 / 75  Vitals Entered By: Golden Circle RN (September 06, 2009 10:01 AM)  Primary Care Aftin Lye:  Cat Ta MD  CC:  IUD and numbness.  History of Present Illness: Tracey Kane [phone] presents for f/u after IUD was inserted and pt has been complaining of some numbness since.  Pt states that this is the 3rd IUD that was inserted.  She has always had vaginal numbness with IUDs but tolerated it as it provided contraception.  But since this last insertion, about 4-5 wks ago, she is having facial numbness (cheeks) and achy pain in hands&legs, and 2 episodes of dysarthria lasting 10 minutes.  Whe noticed that while working about 2-3 wks ago she felt that her mouth was thick and heavy.  Coworkers at this time stated that her speech was not comprehensible.  She did not notice any other abnormalities (facial droop, weakness in upper or lower extremeties, at this time and was able to continue working.  Numbness in facial cheeks Numbness in hands: usually when she is working.  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   Numbness in legs: occurs with work Numbness in vaginalhas been constant with IUDs x 3.   Bleeding/spotting daily since insertion.     Habits & Providers  Alcohol-Tobacco-Diet     Tobacco Status: never  Current Medications (verified): 1)  Albuterol 90 Mcg/act  Aers (Albuterol) .... 2 Puffs Q 4 Hours As Needed For Asthma 2)  Loratadine 10 Mg  Tabs (Loratadine) .... One Tab By Mouth Daily. Please Print Label in Spanish 3)  Famotidine 20 Mg  Tabs (Famotidine) .... One Tab By Mouth Daily. Please Print Label in Spanish 4)  Lactulose 10 Gm/66ml Soln (Lactulose) .... 30 Ml Daily, Qs For One Month 5)  Amitriptyline Hcl 25 Mg Tabs (Amitriptyline Hcl) .Marland Kitchen.. 1 Tablet By Mouth  Qhs For Sleep Label in Spanish, Please 6)  Cyclobenzaprine Hcl 5 Mg Tabs (Cyclobenzaprine Hcl) .Marland Kitchen.. 1 Tab By Mouth Q 8 Hrs As Needed Muscle Pain or Spasm. Please Label in Spanish 7)  Mobic 7.5 Mg Tabs (Meloxicam) .Marland Kitchen.. 1 Tab By Mouth Daily For Pain. Please Label in Spanish  Allergies (verified): 1)  ! Sprintec 28 (Norgestimate-Eth Estradiol)  Past History:  Past Medical History: Last updated: 11/30/2007 -H. pylori (+) on 10/08 ( treated) -GERD  -Asthma moderate persistent  -creatinine 0.63 - 10/05/2005 -A1C (5.1 %) - 10/05/2005 -10/30/05 LDL: 73   Past Surgical History: Last updated: 03/04/2006 10/30/05 ldl: 73 - 11/13/2005, A1C (5.1 %) - 10/05/2005, cretinine 0.63 - 10/05/2005  Risk Factors: Smoking Status: never (09/06/2009)  Review of Systems General:  Complains of fatigue; denies chills, fever, loss of appetite, malaise, sweats, and weight loss. Neuro:  Complains of headaches, inability to speak, and numbness; denies brief paralysis, difficulty with concentration, disturbances in coordination, falling down, memory loss, poor balance, seizures, sensation of room spinning, tingling, tremors, visual disturbances, and weakness.  Physical Exam  General:  Well-developed,well-nourished,in no acute distress; alert,appropriate and cooperative throughout examination. vitals reviewed.  Head:  normocephalic and atraumatic.   Neck:  supple and full ROM.   Heart:  Normal rate and regular rhythm.  S1 and S2 normal without gallop, murmur, click, rub or other extra sounds. Pulses:  R and L carotid,radial,femoral,dorsalis pedis and posterior tibial pulses are full and equal bilaterally Extremities:  No clubbing, cyanosis, edema, or deformity noted with normal full range of motion of all joints.   Neurologic:  No cranial nerve deficits noted. Station and gait are normal. Plantar reflexes are down-going bilaterally. DTRs are symmetrical throughout. Sensory, motor and coordinative functions appear  intact. Skin:  Intact without suspicious lesions or rashes Cervical Nodes:  No lymphadenopathy noted Axillary Nodes:  No palpable lymphadenopathy   Impression & Recommendations:  Problem # 1:  DISTURBANCE OF SKIN SENSATION (ICD-782.0) Assessment Unchanged Cheek numbness 2 wks ago lasted 10 minutes for 2 days.  Speech was not understandable by coworkers.  ?TIA.  Will get prelimimary labs: cbc, cmet, flp, esr, tsh.    Orders: [facility]- Est  Level 4 (99214)Future Orders: CBC-[facility] ([id number]) ... 09/12/2010 Sed Rate (ESR)-[facility] [phone]) ... 09/06/2010 Lipid-[facility] ([id number]) ... 09/18/2010 TSH-[facility] [phone]) ... 09/12/2010 Comp Met-[facility] ([id number]) ... 09/18/2010  Problem # 2:  NUMBNESS, HAND (ICD-782.0) Worse with working.  May be carpap tunnel?  Advised splint at bedtime, mobic for pain.   Orders: [facility]- Est  Level 4 (99214)Future Orders: CBC-[facility] ([id number]) ... 09/12/2010 Sed Rate (ESR)-[facility] [phone]) ... 09/06/2010 Lipid-[facility] ([id number]) ... 09/18/2010 Comp Met-[facility] ([id number]) ... 09/18/2010  Problem # 3:  IUD SURVEILLANCE (ICD-V25.42) Discussed contraceptive options: ocp, depo, implanon, nuvaring.  Pt states allegic reaction to ocp (lip swelling, purple/blue lips), depo (does not like injections).  Pt would like Implanon, but it contains similar hormones as Mirena, so she may still have similar s/e.  Given that at this time I am concerned about TIA, she should not be started on estrogen product.  One option that we have not discussed is diaphragm.  She would nee tog be fitted for one.  We can discuss at f/u appt.  Orders: [facility]- Est  Level 4 (99214)  Complete Medication List: 1)  Albuterol 90 Mcg/act Aers (Albuterol) .... 2 puffs q 4 hours as needed for asthma 2)  Loratadine 10 Mg Tabs (Loratadine) .... One tab by mouth daily. please print label in spanish 3)  Famotidine 20 Mg Tabs (Famotidine) .... One tab by mouth daily. please print label in spanish 4)  Lactulose 10 Gm/79ml Soln (Lactulose)  .... 30 ml daily, qs for one month 5)  Amitriptyline Hcl 25 Mg Tabs (Amitriptyline hcl) .Marland Kitchen.. 1 tablet by mouth qhs for sleep label in spanish, please 6)  Cyclobenzaprine Hcl 5 Mg Tabs (Cyclobenzaprine hcl) .Marland Kitchen.. 1 tab by mouth q 8 hrs as needed muscle pain or spasm. please label in spanish 7)  Mobic 7.5 Mg Tabs (Meloxicam) .Marland Kitchen.. 1 tab by mouth daily for pain. please label in spanish  Patient Instructions: 1)  Please schedule a follow-up appointment in 3-4 weeks with Dr Janalyn Harder for numbness.  2)  Please make appt for cholesterol check.  This has to be fasting, so no food or drink after midnight the night before appointment. 3)  Go to ER if numbness and slurred speech occurs again. 4)  Try to wear a splint when you sleep for hand pain and numbness. 5)  Take mobic 7.5mg  for pain.  Prescriptions: MOBIC 7.5 MG TABS (MELOXICAM) 1 tab by mouth daily for pain. Please label in Spanish  #30 x 2   Entered and Authorized by:   Angeline Slim MD   Signed by:   Angeline Slim MD on 09/06/2009   Method  used:   Tax adviser to        [facility] (retail)       [phone]       [place], [place]  [address]       Ph: [phone]       Fax: [phone]   RxID:   [id number] MOBIC 7.5 MG TABS (MELOXICAM) 1 tab by mouth daily for pain. Please label in Spanish  #30 x 2   Entered and Authorized by:   Angeline Slim MD   Signed by:   Angeline Slim MD on 09/06/2009   Method used:   Faxed to ...       [facility] (retail)       [address] [place], [place]  [address]       Ph: [phone]       Fax: [phone]   RxID:   [id number]
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
## 2010-02-04 NOTE — Assessment & Plan Note (Signed)
Summary: f/u IUD/eo   Vital Signs:  Patient profile:   40 year old female Weight:      150.2 pounds Temp:     98.9 degrees F oral Pulse rate:   118 / minute Pulse rhythm:   regular BP sitting:   111 / 75  (left arm) Cuff size:   regular  Vitals Entered By: Loralee Pacas CMA (August 23, 2009 2:02 PM) CC: follow-up visit for IUD   Primary Care Provider:  Ardeen Garland  MD  CC:  follow-up visit for IUD.  History of Present Illness:  Since IUD placed 4 weeks, ago, has had numbness in her cheeks (face) and vaginal area  Vaginal area numb when she sits, denies discharge, occ spotting Occ she has headache and feels like she  may faint, this can occur any time a day, at rest, at work, with or without exertion, no meds taken. Denies any change in vision, n/v associated Denies weakness in lower ext, no change in bowel or bladder, no chest pain, no SOB Denies vitamins, herbs or new medications    Pt seen in ED 7/29- no diagnosis given, told it was secondary to her IUD and the hormones   Habits & Providers  Alcohol-Tobacco-Diet     Tobacco Status: never     Tobacco Counseling: not indicated; no tobacco use  Current Medications (verified): 1)  Albuterol 90 Mcg/act  Aers (Albuterol) .... 2 Puffs Q 4 Hours As Needed For Asthma 2)  Loratadine 10 Mg  Tabs (Loratadine) .... One Tab By Mouth Daily. Please Print Label in Spanish 3)  Famotidine 20 Mg  Tabs (Famotidine) .... One Tab By Mouth Daily. Please Print Label in Spanish 4)  Lactulose 10 Gm/20ml Soln (Lactulose) .... 30 Ml Daily, Qs For One Month 5)  Amitriptyline Hcl 25 Mg Tabs (Amitriptyline Hcl) .Marland Kitchen.. 1 Tablet By Mouth Qhs For Sleep Label in Spanish, Please 6)  Cyclobenzaprine Hcl 5 Mg Tabs (Cyclobenzaprine Hcl) .Marland Kitchen.. 1 Tab By Mouth Q 8 Hrs As Needed Muscle Pain or Spasm. Please Label in Spanish  Allergies (verified): 1)  ! Sprintec 28 (Norgestimate-Eth Estradiol)  Physical Exam  General:  well-developed, well-nourished, and  well-hydrated.  vitals reviewed.  Head:  normocephalic and no abnormalities observed.   Eyes:  . EOMI. Perrla. Funduscopic exam benign, without hemorrhages, exudates or papilledema. Vision grossly normal. Ears:  External ear exam shows no significant lesions or deformities.  Otoscopic examination reveals clear canals, tympanic membranes are intact bilaterally without bulging, retraction, inflammation or discharge. Hearing is grossly normal bilaterally. Mouth:  Oral mucosa and oropharynx without lesions or exudates.  Neck:  supple, full ROM, and no thyromegaly.   Lungs:  normal respiratory effort, normal breath sounds.   Heart:  Normal rate and regular rhythm.  Genitalia:  normal introitus, no external lesions, clear vaginal discharge, IUD strings not visualized mucosa pink and moist, and no vaginal or cervical lesions.  perineum sensation in tact Neurologic:  alert & oriented X3, cranial nerves II-XII intact, strength normal in all extremities, sensation intact to light touch, gait normal, and DTRs symmetrical and normal.  Decreased sensation in bilat cheeks Skin:  Intact without suspicious lesions or rashes Psych:  normally interactive, good eye contact, not anxious appearing, and not depressed appearing.     Impression & Recommendations:  Problem # 1:  IUD SURVEILLANCE (ICD-V25.42) Assessment Deteriorated Unable to visualize strings, send for U/S Orders: Ultrasound (Ultrasound) [facility]- Est  Level 4 (31517)  Problem # 2:  DISTURBANCE OF  SKIN SENSATION (ICD-782.0) Assessment: New  unclear cause, Cranial nerves in tact, no lack of sensation in perineum, doubt secondary to IUD, given option of trying Vit B complex, vs watch and wait or she may IUD removed, pt to think about. No focal deficits that warrent imaging at this time Given red flags  Orders: [facility]- Est  Level 4 (57846)  Complete Medication List: 1)  Albuterol 90 Mcg/act Aers (Albuterol) .... 2 puffs q 4 hours as needed for  asthma 2)  Loratadine 10 Mg Tabs (Loratadine) .... One tab by mouth daily. please print label in spanish 3)  Famotidine 20 Mg Tabs (Famotidine) .... One tab by mouth daily. please print label in spanish 4)  Lactulose 10 Gm/43ml Soln (Lactulose) .... 30 ml daily, qs for one month 5)  Amitriptyline Hcl 25 Mg Tabs (Amitriptyline hcl) .Marland Kitchen.. 1 tablet by mouth qhs for sleep label in spanish, please 6)  Cyclobenzaprine Hcl 5 Mg Tabs (Cyclobenzaprine hcl) .Marland Kitchen.. 1 tab by mouth q 8 hrs as needed muscle pain or spasm. please label in spanish  Patient Instructions: 1)  Get the ultrasound so I can see where the string are located 2)  Start a vitamin B complex over the counter daily 3)  Think about the IUD, and let me know if you want it removed

## 2010-02-04 NOTE — Progress Notes (Signed)
Summary: resch  Phone Note Call from Patient   Caller: Patient Summary of Call: could not come today because of car trouble Initial call taken by: De Nurse,  January 24, 2009 8:46 AM

## 2010-02-04 NOTE — Progress Notes (Signed)
Summary: imtirex/ts  Phone Note Call from Patient   Caller: Patient Call For: [phone] Summary of Call: Pt lost rx given for refilll.  [facility]    Prescriptions: IMITREX 25 MG TABS (SUMATRIPTAN SUCCINATE) 1 tab by mouth x 1 when you have headache.  May repeat x 1 after 2 hours if headache recurs.  Lable in Spanish.  #20 x 3   Entered by:   Arlyss Repress CMA,   Authorized by:   Angeline Slim MD   Signed by:   Arlyss Repress CMA, on 10/11/2009   Method used:   Electronically to        [facility] [address] (retail)       [phone]       [place], [place]  [address]       Ph: [phone]       Fax: [phone]   RxID:   [id number]

## 2010-02-04 NOTE — Assessment & Plan Note (Signed)
Summary: hurt knee,tcb   Vital Signs:  Patient profile:   40 year old female Height:      61.75 inches Temp:     98.2 degrees F oral Pulse rate:   69 / minute BP sitting:   110 / 66  (left arm) Cuff size:   regular  Vitals Entered By: Tessie Fass CMA (October 21, 2009 2:26 PM) CC: left knee pain x 3 days Pain Assessment Patient in pain? yes     Location: left knee Intensity: 10   Primary Care Provider:  Cat Ta MD  CC:  left knee pain x 3 days.  History of Present Illness: 1. Left knee pain:  Pt hurt her knee left knee last friday.  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  She hit her knee right below the knee cap.  She had immediate pain and some swelling.  She was able to ambulate.  [facility] was [facility] to [facility] where they told her that it was a bruise and given an anti-inflammatory and a pain medicine.  They did not do x-rays.  The pain medicine helps with the pain but the anti-inflammatory doesn't.  Pain is currently rated a 10/10.  It is still swollen.  She is concerned that it could be dislocated.  Current Medications (verified): 1)  Albuterol 90 Mcg/act  Aers (Albuterol) .... 2 Puffs Q 4 Hours As Needed For Asthma 2)  Loratadine 10 Mg  Tabs (Loratadine) .... One Tab By Mouth Daily. Please Print Label in Spanish 3)  Famotidine 20 Mg  Tabs (Famotidine) .... One Tab By Mouth Daily. Please Print Label in Spanish 4)  Lactulose 10 Gm/76ml Soln (Lactulose) .... 30 Ml Daily, Qs For One Month 5)  Amitriptyline Hcl 25 Mg Tabs (Amitriptyline Hcl) .Marland Kitchen.. 1 Tablet By Mouth Qhs For Sleep Label in Spanish, Please 6)  Imitrex 25 Mg Tabs (Sumatriptan Succinate) .Marland Kitchen.. 1 Tab By Mouth X 1 When You Have Headache.  May Repeat X 1 After 2 Hours If Headache Recurs.  Lable in Spanish.  Allergies: 1)  ! Sprintec 28 (Norgestimate-Eth Estradiol)  Social History: Reviewed history and no changes required. Works as a Higher education careers adviser  Exam  General:  Vitals reviewed.  comfortable appearing.  no acute distress. Lungs:  normal respiratory effort.   Heart:  normal rate and regular rhythm.   Msk:  Left hip:  Normal ROM.  non-tender  Left knee:  Slightly swollen and bruised at the patella tendon.  Full ROM, pain at full flexion.  No gross deformity or dislocation.  TTP at tibial tuberosity and lateral inferior joint line.  Good stability.  Mild pain with vargus testing.  Negative lachmans.  Negative McMurray.  No catching or locking.  Right knee: normal appearing.  No swelling.  Good ROM and stability Pulses:  2+ DP pulses Extremities:  no lower extremity edema   Impression & Recommendations:  Problem # 1:  KNEE PAIN, LEFT (ICD-719.46) Assessment New  Likely just a bruise from running into the table.  She is pretty tender to palpation over the tibial tuberosity and lateral joint line.  Will get x-ray to make sure that there is no fracture.  Continue pain meds that she received at [facility].  Orders: Diagnostic X-Ray/Fluoroscopy (Diagnostic X-Ray/Flu) [facility]- Est Level  3 (99213) [facility]- Est Level  3 (16109)  Complete Medication List: 1)  Albuterol 90 Mcg/act Aers (Albuterol) .... 2 puffs q 4  hours as needed for asthma 2)  Loratadine 10 Mg Tabs (Loratadine) .... One tab by mouth daily. please print label in spanish 3)  Famotidine 20 Mg Tabs (Famotidine) .... One tab by mouth daily. please print label in spanish 4)  Lactulose 10 Gm/1ml Soln (Lactulose) .... 30 ml daily, qs for one month 5)  Amitriptyline Hcl 25 Mg Tabs (Amitriptyline hcl) .Marland Kitchen.. 1 tablet by mouth qhs for sleep label in spanish, please 6)  Imitrex 25 Mg Tabs (Sumatriptan succinate) .Marland Kitchen.. 1 tab by mouth x 1 when you have headache.  may repeat x 1 after 2 hours if headache recurs.  lable in spanish.  Other Orders: Influenza Vaccine NON MCR (44010)  Patient Instructions: 1)  It was nice to meet you today 2)  We will get some x-rays of your knee to make sure that  there is no fracture 3)  You can continue to use the Voltaren and Ultram for pain 4)  Ice the knee at least twice a day for 15 minutes 5)  We will right for you to be out of work tomorrow. 6)  Please schedule a follow up appointment in 1 week to check on your knee.   Orders Added: 1)  Diagnostic X-Ray/Fluoroscopy [Diagnostic X-Ray/Flu] 2)  [facility]- Est Level  3 [27253] 3)  Influenza Vaccine NON MCR [00028] 4)  [facility]- Est Level  3 [66440]   Immunizations Administered:  Influenza Vaccine # 1:    Vaccine Type: Fluvax Non-MCR    Site: left deltoid    Mfr: GlaxoSmithKline    Dose: 0.5 ml    Route: IM    Given by: Tessie Fass CMA    Exp. Date: 07/02/2010    Lot #: [id number]    VIS given: 07/30/09 version given October 21, 2009.  Flu Vaccine Consent Questions:    Do you have a history of severe allergic reactions to this vaccine? no    Any prior history of allergic reactions to egg and/or gelatin? no    Do you have a sensitivity to the preservative Thimersol? no    Do you have a past history of Guillan-Barre Syndrome? no    Do you currently have an acute febrile illness? no    Have you ever had a severe reaction to latex? no    Vaccine information given and explained to patient? yes    Are you currently pregnant? no   Immunizations Administered:  Influenza Vaccine # 1:    Vaccine Type: Fluvax Non-MCR    Site: left deltoid    Mfr: GlaxoSmithKline    Dose: 0.5 ml    Route: IM    Given by: Tessie Fass CMA    Exp. Date: 07/02/2010    Lot #: [id number]    VIS given: 07/30/09 version given October 21, 2009.

## 2010-02-04 NOTE — Assessment & Plan Note (Signed)
Summary: np/knee pain per [facility]   History of Present Illness: Dr. Jeanice Lim has requested evaluation of this patient for knee pain.   40 year old female who bumped her left knee while at work on 10/14/011.  pt works at [employer]- describes hitting the anterior aspect of her knee on the corner of an ottoman.  since that time she notes intermittent pain in the knee in multiple areas including superiorly, inferiorly, medially and laterally.  she has been seen X2 at the family practice center, plain knee films were reviewed which are normal with no sign of occult fracture or significant degenerative change.    all discussion done through interpreter.  patient has been on anti-inflammatories which have helped somewhat, however patient returned to work last week and reports significant pain.   she has had a mild effusion, several epidoses of giving way.  she has not had any mechanical locking up of knee joint.  no prior traumatic injury, fracture or surgery.    at the end of interview, the patient and her interpreter/ family member brought up the fact that she has been seen several times at [employer] [facility] as a worker's comp case through [employer].  her understanding is that the case has been released and that she cannot have further follow-up through worker's compensation.  i have no notes or images from these prior encounters.    ROS: bone and joint as above.  no numbness, tingling or neuropathic symptoms.  taking by mouth normally.   GEN: Well-developed,well-nourished,in no acute distress; alert,appropriate and cooperative throughout examination HEENT: Normocephalic and atraumatic without obvious abnormalities. No apparent alopecia or balding. Ears, externally no deformities PULM: Breathing comfortably in no respiratory distress EXT: No clubbing, cyanosis, or edema PSYCH: Normally interactive. Cooperative during the interview. Pleasant. Friendly and conversant. Not anxious or depressed  appearing. Normal, full affect.    left knee; full extension, flexion to 120 degrees.  mild effusion.  tenderness at quad and patellar tendon.  extension is intact.  mildly tender at medial and lateral joint lines.  posterior fullness, stable MCL and LCL.  negative Lachman.  Flexion pinch and McMurry's are positive.  Bounce Home test negative.  non tender at pes anserine.  strength testing 5/5.  neurovascularly intact.      Allergies: 1)  ! Sprintec 28 (Norgestimate-Eth Estradiol)  Past History:  Past medical, surgical, family and social histories (including risk factors) reviewed, and no changes noted (except as noted below).  Past Medical History: Reviewed history from 11/30/2007 and no changes required. -H. pylori (+) on 10/08 ( treated) -GERD  -Asthma moderate persistent  -creatinine 0.63 - 10/05/2005 -A1C (5.1 %) - 10/05/2005 -10/30/05 LDL: 73   Past Surgical History: Reviewed history from 03/04/2006 and no changes required. 10/30/05 ldl: 73 - 11/13/2005, A1C (5.1 %) - 10/05/2005, cretinine 0.63 - 10/05/2005  Family History: Reviewed history and no changes required.  Social History: Reviewed history from 10/21/2009 and no changes required. Works as a Financial trader   Impression & Recommendations:  Problem # 1:  KNEE PAIN, LEFT (ICD-719.46)  Cannot rule out internal derangement. Some may be left over from direct trauma to anterio knee. With residual bone bruise and some mild patellar and quad tendinopathy.  Diagnostic and therapeutic injection. May follow-up with our clinic or go back to worker's comp MD depending on further discussions with employer.  No symptoms prior to injury at work.  Knee Injection Patient verbally consented to procedure. Risks, benefits, and alternatives explained. Sterilely  prepped with betadine. Ethyl cholride used for anesthesia. 9 cc Lidocaine 1% mixed with 1 cc of Kenalog 40 mg injected using the anterolateral approach without difficulty. No  complications with procedure and tolerated well. Patient had decreased pain post-injection.   Her updated medication list for this problem includes:    Mobic 7.5 Mg Tabs (Meloxicam) .Marland Kitchen... 1 by mouth daily for knee inflammation    Ultram 50 Mg Tabs (Tramadol hcl) .Marland Kitchen... 1 by mouth q 6hrs as needed pain  Orders: Joint Aspirate / Injection, Large (20610) Kenalog 10mg  (4units) (J3301)  Complete Medication List: 1)  Albuterol 90 Mcg/act Aers (Albuterol) .... 2 puffs q 4 hours as needed for asthma 2)  Loratadine 10 Mg Tabs (Loratadine) .... One tab by mouth daily. please print label in spanish 3)  Famotidine 20 Mg Tabs (Famotidine) .... One tab by mouth daily. please print label in spanish 4)  Lactulose 10 Gm/55ml Soln (Lactulose) .... 30 ml daily, qs for one month 5)  Amitriptyline Hcl 25 Mg Tabs (Amitriptyline hcl) .Marland Kitchen.. 1 tablet by mouth qhs for sleep label in spanish, please 6)  Imitrex 25 Mg Tabs (Sumatriptan succinate) .Marland Kitchen.. 1 tab by mouth x 1 when you have headache.  may repeat x 1 after 2 hours if headache recurs.  lable in spanish. 7)  Mobic 7.5 Mg Tabs (Meloxicam) .Marland Kitchen.. 1 by mouth daily for knee inflammation 8)  Ultram 50 Mg Tabs (Tramadol hcl) .Marland Kitchen.. 1 by mouth q 6hrs as needed pain   Orders Added: 1)  New Patient Level III [16109] 2)  Joint Aspirate / Injection, Large [20610] 3)  Kenalog 10mg  (4units) [J3301]

## 2010-02-04 NOTE — Progress Notes (Signed)
  Phone Note Outgoing Call   Call placed by: Paula Compton MD,  October 25, 2009 10:00 AM Call placed to: Patient Summary of Call: Called patient to [phone], call completed in Spanish.  Discussed normal x-ray findings.  She continues to have severe pain, unchanged from her visit on Oct 17th.  I recommended that she make an appt at Sports Medicine for evaluation.  She agrees.  Prefers to have call to make appt with aid of Spanish interpreter.  I will forward this note to administrative team, Spanish interpreter in order to make Sports Medicine appt.  Initial call taken by: Paula Compton MD,  October 25, 2009 10:01 AM

## 2010-02-06 NOTE — Assessment & Plan Note (Signed)
Summary: cough x 2 months/not getting better/ta/bmc   Vital Signs:  Patient profile:   39 year old female Height:      61.75 inches Weight:      156 pounds BMI:     28.87 Temp:     98.6 degrees F oral Pulse rate:   67 / minute BP sitting:   123 / 76  (left arm) Cuff size:   regular  Vitals Entered By: Tessie Fass CMA (January 03, 2010 10:29 AM) CC: cough x 2 months   Primary Care Provider:  Cat Ta MD  CC:  cough x 2 months.  History of Present Illness:    Pt seen 4 weeks ago, with cough given Azithromycin for possible bronchitis in setting of her Asthma. Cough has persisted last all day worse at night, occ SOB and wheezing each night with use. Using albuterol 3 times a day at this point Cough worse now that she works in [employer] section at work as well Previously had similar symptoms and Symbicort helped Taking H2 blocker for reflux which is unchanged  No fever, no chest pain, no sick contacts, no new medications besides antibiotic, note stated Azithromycin made her have sweats and nausea, no SOB no rash    Current Medications (verified): 1)  Albuterol 90 Mcg/act  Aers (Albuterol) .... 2 Puffs Q 4 Hours As Needed For Asthma 2)  Loratadine 10 Mg  Tabs (Loratadine) .... One Tab By Mouth Daily. Please Print Label in Spanish 3)  Famotidine 20 Mg  Tabs (Famotidine) .... One Tab By Mouth Daily. Please Print Label in Spanish 4)  Amitriptyline Hcl 25 Mg Tabs (Amitriptyline Hcl) .Marland Kitchen.. 1 Tablet By Mouth Qhs For Sleep Label in Spanish, Please 5)  Imitrex 25 Mg Tabs (Sumatriptan Succinate) .Marland Kitchen.. 1 Tab By Mouth X 1 When You Have Headache.  May Repeat X 1 After 2 Hours If Headache Recurs.  Lable in Spanish. 6)  Symbicort 80-4.5 Mcg/act Aero (Budesonide-Formoterol Fumarate) .... 2 Puffs Inhaled Bid  Allergies (verified): 1)  ! Sprintec 28 (Norgestimate-Eth Estradiol) 2)  ! Zithromax (Azithromycin)  Past History:  Past Medical History: Last updated: 11/30/2007 -H. pylori (+) on  10/08 ( treated) -GERD  -Asthma moderate persistent  -creatinine 0.63 - 10/05/2005 -A1C (5.1 %) - 10/05/2005 -10/30/05 LDL: 73   Review of Systems       Per HPI  Physical Exam  General:  Well-developed,well-nourished,in no acute distress; alert,appropriate and cooperative throughout examination. vitals reviewed.  Nose:  nares clear Mouth:  good dentition and pharynx pink and moist.  no oral lesions Lungs:  CTAB, coughing throughout exam increased WOB with cough speaking in full sentences Heart:  RRR   Impression & Recommendations:  Problem # 1:  ASTHMA, INTERMITTENT, MODERATE (ICD-493.10) Assessment Deteriorated  I think either a viral illness or allergens from work has offset her asthma with the persistant cough and wheezing episodes, initially to start flovent, pt preferred Symbicort as this has helped in the past. Recheck in 3 weeks, if no improvment would obtain CXR s/p course of antibiotics, no red flags today Continue anti-histamine and H2 blocker Her updated medication list for this problem includes:    Albuterol 90 Mcg/act Aers (Albuterol) .Marland Kitchen... 2 puffs q 4 hours as needed for asthma    Symbicort 80-4.5 Mcg/act Aero (Budesonide-formoterol fumarate) .Marland Kitchen... 2 puffs inhaled bid  Orders: [facility]- Est Level  3 (16109)  Complete Medication List: 1)  Albuterol 90 Mcg/act Aers (Albuterol) .... 2 puffs q 4 hours as  needed for asthma 2)  Loratadine 10 Mg Tabs (Loratadine) .... One tab by mouth daily. please print label in spanish 3)  Famotidine 20 Mg Tabs (Famotidine) .... One tab by mouth daily. please print label in spanish 4)  Amitriptyline Hcl 25 Mg Tabs (Amitriptyline hcl) .Marland Kitchen.. 1 tablet by mouth qhs for sleep label in spanish, please 5)  Imitrex 25 Mg Tabs (Sumatriptan succinate) .Marland Kitchen.. 1 tab by mouth x 1 when you have headache.  may repeat x 1 after 2 hours if headache recurs.  lable in spanish. 6)  Symbicort 80-4.5 Mcg/act Aero (Budesonide-formoterol fumarate) .... 2 puffs  inhaled bid  Patient Instructions: 1)  Continue your albuterol 1-2 puffs every 4 hours as needed 2)  Start the Flovent twice a day 3)  Continue the Loratadine 4)  Recheck 3 weeks  5)  Continue con el Albutarol 1-2 puffs cada 4    horas como necesite  6)  Empiece el Flovent 2 veces al dia  7)  Continue con la Loratadina  8)  Rechekeo en 3 semanas  Prescriptions: SYMBICORT 80-4.5 MCG/ACT AERO (BUDESONIDE-FORMOTEROL FUMARATE) 2 puffs inhaled BID  #1 x 3   Entered and Authorized by:   Milinda Antis MD   Signed by:   Milinda Antis MD on 01/03/2010   Method used:   Print then Give to Patient   RxID:   [id number] ALBUTEROL 90 MCG/ACT  AERS (ALBUTEROL) 2 puffs q 4 hours as needed for asthma  #1 x 11   Entered and Authorized by:   Milinda Antis MD   Signed by:   Milinda Antis MD on 01/03/2010   Method used:   Print then Give to Patient   RxID:   [id number] FLOVENT HFA 110 MCG/ACT AERO (FLUTICASONE PROPIONATE  HFA) 1 puff inhaled two times a day for asthma  #1 x 3   Entered and Authorized by:   Milinda Antis MD   Signed by:   Milinda Antis MD on 01/03/2010   Method used:   Print then Give to Patient   RxID:   [id number]    Orders Added: 1)  [facility]- Est Level  3 [29518]

## 2010-02-06 NOTE — Assessment & Plan Note (Signed)
Summary: knee,df   Vital Signs:  Patient profile:   40 year old female Height:      61.75 inches Weight:      152 pounds BMI:     28.13 Temp:     98.4 degrees F oral Pulse rate:   79 / minute BP sitting:   110 / 70  (left arm) Cuff size:   regular  Vitals Entered By: Tessie Fass CMA (December 16, 2009 9:29 AM) CC: left knee pain Pain Assessment Patient in pain? yes     Location: left knee Intensity: 9   Primary Care Seona Clemenson:  Cat Ta MD  CC:  left knee pain.  History of Present Illness: 40 y/o F with migraine, gerd, insomnia is here for L knee pain present for months.  She was seen in [facility] and Dr Darrick Penna did musculoskel u/s that was essentially negative.  She was seen by Dr Dallas Schimke, who gave her a steroid injection.  She states that this helped pain for only a couple of days.  She has been in Physical Therapy but was told last week that her Worker's Comp will not pay for anymore sessions.  She is still having pain and sometimes feels that knee is locking up.  She is back to work.   +Pain -swelling +locking -falling  -instability  -redness   Habits & Providers  Alcohol-Tobacco-Diet     Tobacco Status: never  Current Medications (verified): 1)  Albuterol 90 Mcg/act  Aers (Albuterol) .... 2 Puffs Q 4 Hours As Needed For Asthma 2)  Loratadine 10 Mg  Tabs (Loratadine) .... One Tab By Mouth Daily. Please Print Label in Spanish 3)  Famotidine 20 Mg  Tabs (Famotidine) .... One Tab By Mouth Daily. Please Print Label in Spanish 4)  Amitriptyline Hcl 25 Mg Tabs (Amitriptyline Hcl) .Marland Kitchen.. 1 Tablet By Mouth Qhs For Sleep Label in Spanish, Please 5)  Imitrex 25 Mg Tabs (Sumatriptan Succinate) .Marland Kitchen.. 1 Tab By Mouth X 1 When You Have Headache.  May Repeat X 1 After 2 Hours If Headache Recurs.  Lable in Spanish.  Allergies (verified): 1)  ! Sprintec 28 (Norgestimate-Eth Estradiol)  Review of Systems       per hpi   Physical Exam  General:  Well-developed,well-nourished,in no  acute distress; alert,appropriate and cooperative throughout examination. vitals reviewed.  Msk:  L knee: ice on knee no swelling    Impression & Recommendations:  Problem # 1:  KNEE PAIN, LEFT (ICD-719.46) Assessment Unchanged Pain still present.  Discussed that Dr Darrick Penna [facility]) and Dr Dallas Schimke (Orthopedic) have seen her and their exam, including musculoskeletal U/S was essentially normal.  Pt's Physical Therapy sessions has been cancelled by her Worker's Comp.  Advised pt that since her exam has been wnl per [facility], best thing is to do the knee exercises at home.  Take Tylenol 1000mg  three times a day for pain, esp 30 minutes before exercises.   Gave exercises for knee strengthening:  Quarter Squats and Straight Leg raise.  Handout given that included pictures.  Pt given instructions by Spanish interpretor.  Pt to rtc in 1 month.    The following medications were removed from the medication list:    Ultram 50 Mg Tabs (Tramadol hcl) .Marland Kitchen... 1 by mouth q 6hrs as needed pain    Tylenol With Codeine #3 300-30 Mg Tabs (Acetaminophen-codeine) .Marland Kitchen... 1 pill by mouth at night for cough  Orders: [facility]- Est Level  3 (16109)  Complete Medication List: 1)  Albuterol 90 Mcg/act Aers (Albuterol) .... 2 puffs q 4 hours as needed for asthma 2)  Loratadine 10 Mg Tabs (Loratadine) .... One tab by mouth daily. please print label in spanish 3)  Famotidine 20 Mg Tabs (Famotidine) .... One tab by mouth daily. please print label in spanish 4)  Amitriptyline Hcl 25 Mg Tabs (Amitriptyline hcl) .Marland Kitchen.. 1 tablet by mouth qhs for sleep label in spanish, please 5)  Imitrex 25 Mg Tabs (Sumatriptan succinate) .Marland Kitchen.. 1 tab by mouth x 1 when you have headache.  may repeat x 1 after 2 hours if headache recurs.  lable in spanish.   Orders Added: 1)  [facility]- Est Level  3 [78469]

## 2010-02-18 ENCOUNTER — Encounter: Payer: Self-pay | Admitting: Family Medicine

## 2010-02-18 ENCOUNTER — Ambulatory Visit (INDEPENDENT_AMBULATORY_CARE_PROVIDER_SITE_OTHER): Payer: Self-pay | Admitting: Family Medicine

## 2010-02-18 VITALS — BP 118/74 | HR 78 | Temp 98.5°F | Wt 158.0 lb

## 2010-02-18 DIAGNOSIS — R05 Cough: Secondary | ICD-10-CM | POA: Insufficient documentation

## 2010-02-18 DIAGNOSIS — K219 Gastro-esophageal reflux disease without esophagitis: Secondary | ICD-10-CM

## 2010-02-18 DIAGNOSIS — R059 Cough, unspecified: Secondary | ICD-10-CM | POA: Insufficient documentation

## 2010-02-18 DIAGNOSIS — J45909 Unspecified asthma, uncomplicated: Secondary | ICD-10-CM

## 2010-02-18 MED ORDER — BECLOMETHASONE DIPROPIONATE 80 MCG/ACT IN AERS: 1.0000 | INHALATION_SPRAY | Freq: Two times a day (BID) | RESPIRATORY_TRACT | Status: DC

## 2010-02-18 MED ORDER — OMEPRAZOLE 20 MG PO TBEC: 20.0000 mg | DELAYED_RELEASE_TABLET | Freq: Every day | ORAL | Status: DC

## 2010-02-18 NOTE — Assessment & Plan Note (Addendum)
Has been ongoing for 2+ months, now with post-tussive emesis most prominent at night.  Ddx includes viral vs environmental exacerbation of asthma vs undertreated GERD vs pertussis vs PNA -pt unable to fill symbicort Rx'ed at last visit, will give Rx for Qvar to fill at [facility] -will also start pt on PPI to help with GERD control -no fevers or sputum production, lung exam benign, unlikely to be a PNA, no CXR indicated at this time -consider CXR if not improving once on new meds x3 weeks -keep pertussis as a possible source w/ post-tussive emesis, would be supportive care only  -Peak flow baseline done in clinic today -PCP could consider DTap if pt has not had immunizations

## 2010-02-18 NOTE — Progress Notes (Signed)
  Subjective:    Patient ID: Tracey Kane, female    DOB: [birth year], 40 y.o.   MRN: [id number]  Pt has had cough >2 months.  Was seen in clinic 12/30 and given Rx for Symbicort, but was not able to fill at that time due to financial reasons and lost the Rx once she was able to pay for it.  Has been having some post-tussive emesis, most recently last night, and before that 2-3 nights ago.  It happens when she has a coughing spell after she eats.  No fevers. No hemoptysis. No URI symptoms. Does have some SOB/feels like she is wheezing at times. No other symptoms.  Has been using albuterol inhaler every 4-6hrs for cough and SOB.  Feels like it helps some. Is taking her H2 blocker.  Cough This is a chronic problem. The current episode started more than 1 month ago. The problem has been unchanged. The problem occurs every few minutes. The cough is non-productive. Associated symptoms include shortness of breath and wheezing. Pertinent negatives include no fever, hemoptysis, nasal congestion, postnasal drip, rash, rhinorrhea or sore throat. The symptoms are aggravated by lying down. Risk factors for lung disease include occupational exposure. She has tried steroid inhaler for the symptoms. The treatment provided moderate relief. Her past medical history is significant for asthma.      Review of Systems  Constitutional: Negative for fever.  HENT: Negative for sore throat, rhinorrhea and postnasal drip.   Respiratory: Positive for cough, shortness of breath and wheezing. Negative for hemoptysis.   Skin: Negative for rash.       Objective:   Physical Exam  Constitutional: She appears well-developed and well-nourished.       Frequently coughing, nonproductive, every 1-2 min  HENT:  Head: Normocephalic and atraumatic.  Eyes: Conjunctivae are normal. Pupils are equal, round, and reactive to light.  Neck: Neck supple.  Cardiovascular: Normal rate and regular rhythm.   Pulmonary/Chest: Breath sounds  normal. No respiratory distress. She has no wheezes.  Lymphadenopathy:    She has no cervical adenopathy.  Skin: No rash noted.          Assessment & Plan:

## 2010-02-18 NOTE — Patient Instructions (Signed)
It is very important to go get this new inhaler filled. Start using your inhaler TWICE per day EVERY day. Once you have started the new medicine, please try to decrease the amount that you are using your albuterol-- you can continue to use it every 4-6 hours for the next 4-5 days, then try to decrease it and only use it when you are wheezing or short of breath. I am also giving you a prescription for a stronger acid reflux medicine. Please start taking this. Come back if you start having fevers, cannot catch your breath even when using your inhalers, or if you are still having persistent cough in 3-4 weeks.

## 2010-03-22 LAB — URINALYSIS, ROUTINE W REFLEX MICROSCOPIC
Bilirubin Urine: NEGATIVE
Glucose, UA: NEGATIVE mg/dL
Hgb urine dipstick: NEGATIVE
Ketones, ur: NEGATIVE mg/dL
Nitrite: NEGATIVE
Specific Gravity, Urine: 1.023 (ref 1.005–1.030)
pH: 6 (ref 5.0–8.0)

## 2010-04-09 LAB — POCT URINALYSIS DIP (DEVICE)
Glucose, UA: NEGATIVE mg/dL
Ketones, ur: NEGATIVE mg/dL
Protein, ur: NEGATIVE mg/dL
Urobilinogen, UA: 0.2 mg/dL (ref 0.0–1.0)

## 2010-04-12 LAB — POCT PREGNANCY, URINE: Preg Test, Ur: NEGATIVE

## 2010-04-12 LAB — POCT URINALYSIS DIP (DEVICE)
Ketones, ur: 15 mg/dL — AB
Urobilinogen, UA: 0.2 mg/dL (ref 0.0–1.0)

## 2010-04-28 ENCOUNTER — Ambulatory Visit (INDEPENDENT_AMBULATORY_CARE_PROVIDER_SITE_OTHER): Payer: Self-pay | Admitting: Family Medicine

## 2010-04-28 ENCOUNTER — Ambulatory Visit: Payer: Self-pay | Admitting: Family Medicine

## 2010-04-28 ENCOUNTER — Encounter: Payer: Self-pay | Admitting: Family Medicine

## 2010-04-28 VITALS — BP 116/78 | HR 74 | Temp 98.7°F | Ht 63.0 in | Wt 154.0 lb

## 2010-04-28 DIAGNOSIS — A562 Chlamydial infection of genitourinary tract, unspecified: Secondary | ICD-10-CM

## 2010-04-28 LAB — POCT WET PREP (WET MOUNT): Yeast Wet Prep HPF POC: NEGATIVE

## 2010-04-28 MED ORDER — AZITHROMYCIN 1 G PO PACK
1.0000 g | PACK | Freq: Once | ORAL | Status: AC
  Administered 2010-04-28: 1 g via ORAL

## 2010-04-28 NOTE — Assessment & Plan Note (Addendum)
Will treat for CT since boyfriend has tested positive, gave condoms. Past history of nausea to azithromycin, this does not constitute an allergy, 1GM given in office.

## 2010-04-28 NOTE — Progress Notes (Signed)
  Subjective:    Patient ID: Tracey Kane, female    DOB: Mar 26, 1970, 40 y.o.   MRN: [id number]  HPI  Boyfriend told her that he has chlamydia, he lives with her.  She speaks very little [place].  Cart at work ran over there right great toe and broke the nail.  It is not causing her any pain.    Review of Systems  Constitutional: Negative for fever and chills.  Genitourinary: Negative for dysuria, vaginal discharge, vaginal pain and pelvic pain.       Objective:   Physical Exam  Constitutional: She appears well-developed and well-nourished.  Genitourinary: Uterus normal. Vaginal discharge found.       Mucus from cervical os  Skin:       Left great toe nail was fractured, small sub-ungual hemorrhage no pain.          Assessment & Plan:

## 2010-04-29 ENCOUNTER — Encounter: Payer: Self-pay | Admitting: Family Medicine

## 2010-04-29 LAB — GC/CHLAMYDIA PROBE AMP, GENITAL: GC Probe Amp, Genital: NEGATIVE

## 2010-05-23 NOTE — Group Therapy Note (Signed)
Tracey Kane, Tracey Kane                ACCOUNT NO.:  [id number]   MEDICAL RECORD NO.:  [id number]          PATIENT TYPE:  WOC   LOCATION:  [facility]                   FACILITY:  [facility]   PHYSICIAN:  Ginger Carne, MD DATE OF BIRTH:  18-Jun-1970   DATE OF SERVICE:                                    CLINIC NOTE   REASON FOR CONSULTATION:  Gynecologic evaluation.   HISTORY OF PRESENT ILLNESS:  This is a 40 year-old multiparous female with a  ParaGard intrauterine device present for gynecological evaluation.  She  complains of urinary stress incontinence symptoms;  however, she has no  symptoms compatible with an over active bladder.  Due to issues related to  insurance at this time it is not practical for the patient to proceed with  further evaluation until her resources change.  The patient's GUSI is  considered to be elective in nature as far as repair.  She is on folic acid  since her last delivery and when she attempts to stop said folic acid the  patient states that she becomes tired.  No complaints related to menses,  every 28 to 30 days lasting 4-5 days.   PHYSICAL EXAMINATION:  External genitalia, vulva, vagina normal.  Cervix is  smooth with no abrasions or lesions.  Uterus is small and anteverted,  flexible, adnexa negative and found to be negative upon palpation.   IMPRESSION:  Normal gynecological evaluation.   PLAN:  Patient will return in one year.  The patient has an intrauterine  device for contraception and will consider further evaluation for her  incontinence if her financial situation changes.           ______________________________  Ginger Carne, MD     SHB/MEDQ  D:  07/09/2005  T:  07/09/2005  Job:  [id number]

## 2010-06-10 ENCOUNTER — Encounter: Payer: Self-pay | Admitting: Family Medicine

## 2010-06-10 ENCOUNTER — Ambulatory Visit (INDEPENDENT_AMBULATORY_CARE_PROVIDER_SITE_OTHER): Payer: Self-pay | Admitting: Family Medicine

## 2010-06-10 VITALS — BP 120/74 | HR 76 | Temp 98.2°F | Ht 62.5 in | Wt 156.9 lb

## 2010-06-10 DIAGNOSIS — L97509 Non-pressure chronic ulcer of other part of unspecified foot with unspecified severity: Secondary | ICD-10-CM

## 2010-06-10 DIAGNOSIS — L97529 Non-pressure chronic ulcer of other part of left foot with unspecified severity: Secondary | ICD-10-CM | POA: Insufficient documentation

## 2010-06-10 NOTE — Patient Instructions (Signed)
Wound Care Wound care helps prevent pain and infection. There is a greater chance of infection if:  The wound breaks open the skin (puncture).   The wound is deep.   You or your child has diabetes.   You or your child has a weakened system because of:   Chemotherapy.   Radiation therapy.   Illness.  HOME CARE  Rest the injured area until the pain and puffiness (swelling) are better.   Keep the injured area above the heart (elevated) until the pain and puffiness are better.   Clean the wound daily with mild soap and water.   Use a bandage to protect the wound.   If told to do so, apply antibiotic cream after you clean the wound.  YOU MIGHT NEED A TETANUS SHOT IF:  You cannot remember when your or your child's last tetanus shot was.   You or your child has never had a tetanus shot.   The object that caused the wound was dirty.  GET HELP RIGHT AWAY IF:  You or your child has a temperature by mouth above 100.4, not controlled by medicine.   Your baby is older than 3 months with a rectal temperature of 102 F (38.9 C) or higher.   Your baby is 69 months old or younger with a rectal temperature of 100.4 F (38 C) or higher.   You see fluid coming from the wound.   There is pain that is not helped by medicine.   The wound stops the fingers or toes from being able to move (if the wound affects these areas).   You see red streaking of the skin above or below the wound.  MAKE SURE YOU:  Understand these instructions.   Will watch this condition.   Will get help right away if you or your child is not doing well or gets worse.  Document Released: 10/01/2007 Document Re-Released: 03/18/2009 [facility] Patient Information 2011 [place], [place].

## 2010-06-10 NOTE — Assessment & Plan Note (Signed)
Pt with a small ulcer after a wound was further injured by application of vinegar.  There is no signs of necrotic tissue that needs to be cleaned or signs of secondary infection.  Reviewed normal wound care.  Reviewed precautions.  Advised her to follow up if not better or if she develops: redness, swelling, fevers, or other concerns.  She voiced her understanding.

## 2010-06-10 NOTE — Progress Notes (Signed)
  Subjective:    Patient ID: Tracey Kane, female    DOB: 12/30/1970, 40 y.o.   MRN: [id number]  HPI 1. Left foot ulcer:  She was cleaning her toes with vinegar about 7 days ago.  She had a small wound on her pinky toe in between the 4th and 5th toes.  When she put vinegar on this she immediately had pain.  Since then the wound has been getting a little bit bigger and is now a small ulcer.  She has been using Neosporin and keeping it covered with a band-aid.  It is about the same for the past couple of day.   Review of Systems Denies fevers, chills, other skin breakdown    Objective:   Physical Exam  Vitals reviewed. Constitutional: No distress.  Cardiovascular: Normal rate.   Pulmonary/Chest: Effort normal.  Skin:       Small ulcer (<1cm) on the inside of the left 5th toe.  There is no surrounding erythema or swelling.  Normal ROM and sensation of the toe.  Normal cap refill (< 2 seconds).  No areas or necrotic skin.  No signs of secondary infection.          Assessment & Plan:

## 2010-10-01 ENCOUNTER — Ambulatory Visit (INDEPENDENT_AMBULATORY_CARE_PROVIDER_SITE_OTHER): Payer: Self-pay | Admitting: Family Medicine

## 2010-10-01 ENCOUNTER — Encounter: Payer: Self-pay | Admitting: Family Medicine

## 2010-10-01 VITALS — BP 108/73 | HR 66 | Temp 98.3°F | Wt 153.1 lb

## 2010-10-01 DIAGNOSIS — J029 Acute pharyngitis, unspecified: Secondary | ICD-10-CM | POA: Insufficient documentation

## 2010-10-01 MED ORDER — IBUPROFEN 600 MG PO TABS: 600.0000 mg | ORAL_TABLET | Freq: Four times a day (QID) | ORAL | Status: AC | PRN

## 2010-10-01 MED ORDER — PHENOL 1.4 % MT LIQD: 1.0000 | OROMUCOSAL | Status: DC | PRN

## 2010-10-01 MED ORDER — ALBUTEROL 90 MCG/ACT IN AERS: 2.0000 | INHALATION_SPRAY | RESPIRATORY_TRACT | Status: DC | PRN

## 2010-10-01 MED ORDER — IBUPROFEN 600 MG PO TABS: 600.0000 mg | ORAL_TABLET | Freq: Four times a day (QID) | ORAL | Status: DC | PRN

## 2010-10-01 NOTE — Assessment & Plan Note (Signed)
Likely viral. No fevers, significant tonsillar adenopathy or exudates. Ibuprofen and chloraseptic spray as needed for symptoms. Will refill albuterol. Given red flags to RTC especially if has persistent breathing problems.

## 2010-10-01 NOTE — Progress Notes (Signed)
  Subjective:    Patient ID: Tracey Kane, female    DOB: 1970/03/27, 40 y.o.   MRN: [id number]  HPI Complaining of sore throat for the past several days. Difficulty swallowing but eating and drinking okay. Now with some headache and generalized feeling unwell. Denies fevers at home but has felt warm.  Has history of asthma. Uses albuterol every several days. Used twice last night. SOB improved after 2nd dose.   Review of Systems Per HPI with inclusion of following: Denies nausea/vomiting/diarrhea/constipation.     Objective:   Physical Exam Gen: appears unwell HEENT: no conjunctival erythema/tearing, no nasal discharge, no tonsillar enlargement but mild pharyngeal erythema; no palpable lymph nodes CV: RRR, no m/r/g Pulm: CTAB with no w/r/r, no increased WOB Abd: NABS, soft, NT, ND Ext: no edema    Assessment & Plan:

## 2010-10-01 NOTE — Patient Instructions (Addendum)
Probablamente tiene una infeccion de virus.  Tome ibuprofen contra el dolor/fiebre y [place] el espray contra el dolor de Advertising copywriter. Tome mucho agua y descanse.   Si tenga mas dificultad con las respiraciones, sus sintomas no mejoran y tenga mas fiebres y Engineer, mining en los proximos 3-5 dias, regrese a Event organiser.

## 2010-10-17 ENCOUNTER — Ambulatory Visit (INDEPENDENT_AMBULATORY_CARE_PROVIDER_SITE_OTHER): Payer: Self-pay | Admitting: *Deleted

## 2010-10-17 DIAGNOSIS — Z23 Encounter for immunization: Secondary | ICD-10-CM

## 2010-11-29 ENCOUNTER — Other Ambulatory Visit: Payer: Self-pay | Admitting: Family Medicine

## 2010-11-29 MED ORDER — FAMOTIDINE 20 MG PO TABS: 20.0000 mg | ORAL_TABLET | Freq: Every day | ORAL | Status: DC

## 2011-02-09 ENCOUNTER — Ambulatory Visit (INDEPENDENT_AMBULATORY_CARE_PROVIDER_SITE_OTHER): Payer: Self-pay | Admitting: Family Medicine

## 2011-02-09 DIAGNOSIS — J069 Acute upper respiratory infection, unspecified: Secondary | ICD-10-CM

## 2011-02-09 MED ORDER — ALBUTEROL 90 MCG/ACT IN AERS: 2.0000 | INHALATION_SPRAY | RESPIRATORY_TRACT | Status: DC | PRN

## 2011-02-09 MED ORDER — FAMOTIDINE 20 MG PO TABS: 20.0000 mg | ORAL_TABLET | Freq: Every day | ORAL | Status: DC

## 2011-02-09 NOTE — Patient Instructions (Signed)
Tos: Mucinex 1200mg  2 x al dia Albuterol como necesita  Dolor de garganta: Ibuprofen Chloraseptic Salt water gargles

## 2011-02-09 NOTE — Progress Notes (Signed)
  Subjective:    Patient ID: Tracey Kane, female    DOB: 08-Mar-1970, 41 y.o.   MRN: [id number]  HPI Cough x1 week: Patient reports cough, difficulty taking deep breath x1 week. Has history of asthma and states that it feels like she is having asthma attacks.  Denies any wheezing. But states that she has a heaviness on her chest that is not relieved except by using albuterol inhaler. Patient currently out of albuterol. Cough is worse at night. Has positive sick contact in a house with cold symptoms. Positive sore throat.Positive occasional nausea. Off and on headache.No fever.  No vomiting. Eating and drinking well. No joint pain. No problems with bowels or urination.  Review of Systems As per above.    Objective:   Physical Exam  Constitutional: She appears well-developed and well-nourished.  HENT:  Head: Normocephalic and atraumatic.  Nose: Nose normal.  Mouth/Throat: Oropharynx is clear and moist.  Eyes: Conjunctivae are normal. Right eye exhibits no discharge. Left eye exhibits no discharge.  Neck: Normal range of motion. Neck supple. No thyromegaly present.  Cardiovascular: Normal rate, regular rhythm and normal heart sounds.   No murmur heard. Pulmonary/Chest: Effort normal and breath sounds normal. No respiratory distress. She has no wheezes. She has no rales. She exhibits no tenderness.  Abdominal: Soft. She exhibits no distension.  Musculoskeletal: She exhibits no edema.  Lymphadenopathy:    She has no cervical adenopathy.  Neurological: She is alert.  Skin: No rash noted.  Psychiatric: She has a normal mood and affect.          Assessment & Plan:

## 2011-02-11 DIAGNOSIS — J069 Acute upper respiratory infection, unspecified: Secondary | ICD-10-CM | POA: Insufficient documentation

## 2011-02-11 NOTE — Assessment & Plan Note (Signed)
Symptomatic treatment only at this time.  Good air movement.  No wheezing.  Since pt has history of asthma and albuterol gives some relief will encourage pt to continue to use albuterol prn.  Gave new rx for albuterol.  Pt to return if any new or worsening of symptoms.  Reviewed red flags with pt.

## 2011-02-20 ENCOUNTER — Ambulatory Visit (INDEPENDENT_AMBULATORY_CARE_PROVIDER_SITE_OTHER): Payer: Self-pay | Admitting: Family Medicine

## 2011-02-20 ENCOUNTER — Ambulatory Visit (HOSPITAL_COMMUNITY)
Admission: RE | Admit: 2011-02-20 | Discharge: 2011-02-20 | Disposition: A | Discharge status: Home / Self Care | Payer: Self-pay | Source: Ambulatory Visit | Attending: Family Medicine | Admitting: Family Medicine

## 2011-02-20 ENCOUNTER — Encounter: Payer: Self-pay | Admitting: Family Medicine

## 2011-02-20 VITALS — BP 117/79 | HR 65 | Temp 98.7°F | Ht 63.0 in | Wt 152.0 lb

## 2011-02-20 DIAGNOSIS — R059 Cough, unspecified: Secondary | ICD-10-CM | POA: Insufficient documentation

## 2011-02-20 DIAGNOSIS — R05 Cough: Secondary | ICD-10-CM

## 2011-02-20 DIAGNOSIS — Z111 Encounter for screening for respiratory tuberculosis: Secondary | ICD-10-CM

## 2011-02-20 MED ORDER — ALBUTEROL 90 MCG/ACT IN AERS: 2.0000 | INHALATION_SPRAY | RESPIRATORY_TRACT | Status: DC | PRN

## 2011-02-20 NOTE — Patient Instructions (Signed)
I am sending you for an x-ray today. I will call you this afternoon with the results. I am sending in some albuterol for you as well. Please come back first thing Monday morning to followup. I will call you at [phone]

## 2011-02-20 NOTE — Assessment & Plan Note (Signed)
Patient with recent visit for cough. Complaining now of 5 weeks of cough, weight loss, fevers, hemoptysis. Born in [place] and came to this country 14 years ago. Given length of cough, we'll check chest x-ray today. Will also place PPD. See back in 3 days for recheck on Monday. Concern for pneumonia, tuberculosis, viral syndrome with cough.

## 2011-02-20 NOTE — Progress Notes (Signed)
  Subjective:    Patient ID: Tracey Kane, female    DOB: November 22, 1970, 41 y.o.   MRN: [id number]  HPI Patient presents today with daughter with complaint of cough. She states that her cough has lasted 5 weeks. She thinks it is getting worse. She complains of fever to 102 as well as hemoptysis and weight loss. She states that she feels nauseous and is not eating well. She does have a history of asthma and has run out of her albuterol inhaler. She is urgent from [place] and moved her 14 years ago. She denies any contact with relatives from [place] and has not been back to visit. Patient is a nonsmoker   Review of Systems No V/D/C    Objective:   Physical Exam GEN-alert, mild discomfort, coughing and exam room Heart - normal rate, regular rhythm, normal S1, S2, no murmurs, rubs, clicks or gallops Chest - clear to auscultation, no wheezes, rales or rhonchi, symmetric air entry, no tachypnea, retractions or cyanosis Abdomen - soft, nontender, nondistended, no masses or organomegaly        Assessment & Plan:

## 2011-02-23 ENCOUNTER — Encounter: Payer: Self-pay | Admitting: Family Medicine

## 2011-02-23 ENCOUNTER — Ambulatory Visit (INDEPENDENT_AMBULATORY_CARE_PROVIDER_SITE_OTHER): Payer: Self-pay | Admitting: Family Medicine

## 2011-02-23 VITALS — BP 121/78 | HR 88 | Temp 97.9°F | Ht 63.0 in | Wt 152.0 lb

## 2011-02-23 DIAGNOSIS — R05 Cough: Secondary | ICD-10-CM

## 2011-02-23 DIAGNOSIS — R059 Cough, unspecified: Secondary | ICD-10-CM

## 2011-02-23 LAB — TB SKIN TEST
Induration: NEGATIVE
TB Skin Test: NEGATIVE mm

## 2011-02-23 MED ORDER — DOXYCYCLINE HYCLATE 100 MG PO TABS: 100.0000 mg | ORAL_TABLET | Freq: Two times a day (BID) | ORAL | Status: AC

## 2011-02-23 NOTE — Progress Notes (Signed)
  Subjective:    Patient ID: Tracey Kane, female    DOB: [birth year], 41 y.o.   MRN: [id number]  HPI  Patient returns for evaluation of cough. Her cough is worsening. She describes the cough is mildly productive. she continues to have fevers of 101 and 102. She is taking Motrin which were bring down her fevers. She is using her albuterol which helps somewhat.  Review of Systems Denies CP, SOB, HA, N/V/D     Objective:   Physical Exam Gen-tired appearing, alert Heart - normal rate, regular rhythm, normal S1, S2, no murmurs, rubs, clicks or gallops Chest - clear to auscultation, no wheezes, rales or rhonchi, symmetric air entry, no tachypnea, retractions or cyanosis Skin- no rash  PPD negative      Assessment & Plan:

## 2011-02-23 NOTE — Patient Instructions (Signed)
I have sent in doxycycline for you [facility] Please come back in one week for follow up

## 2011-02-23 NOTE — Assessment & Plan Note (Signed)
PPD negative but worsening cough and fevers.  Will treat with doxycycline. Allergic to azithromycin. See back one week.  No wheezes today but possibly a COPD exacerbation in the setting of long-term asthma?

## 2011-02-24 ENCOUNTER — Ambulatory Visit: Payer: Self-pay | Admitting: Family Medicine

## 2011-02-27 ENCOUNTER — Telehealth: Payer: Self-pay | Admitting: Family Medicine

## 2011-02-27 NOTE — Telephone Encounter (Signed)
Pt needs work note because she was here on Monday for cough, so she needs it for that day and also for today since she has the cough still and she didn't go into work today.

## 2011-03-04 ENCOUNTER — Telehealth: Payer: Self-pay | Admitting: Family Medicine

## 2011-03-04 NOTE — Telephone Encounter (Signed)
Clifton Custard is calling for a note to be faxed to [facility] and [employer] stating that she is able to return to work.  Please fax to [facility] attention [phone].

## 2011-03-04 NOTE — Telephone Encounter (Signed)
Forward to PCP for note 

## 2011-03-06 NOTE — Telephone Encounter (Signed)
I am not in the office today.  Please call pt and make sure she is improved.  If she hasn't improved please schedule for an appt (she was supposed to come for a recheck on Monday).   If she has improved you can write her a note to return back to work.  And she can return for f/up as needed.

## 2011-03-10 ENCOUNTER — Encounter: Payer: Self-pay | Admitting: Family Medicine

## 2011-03-10 NOTE — Telephone Encounter (Signed)
Spoke with patient.  States that she missed work last week from pneumonia.  Needs a work excuse for the days that she missed last week.  Now back at work and feels much better. 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

## 2011-04-18 ENCOUNTER — Emergency Department (HOSPITAL_COMMUNITY)
Admission: EM | Admit: 2011-04-18 | Discharge: 2011-04-18 | Disposition: A | Discharge status: Home / Self Care | Payer: Self-pay | Source: Home / Self Care | Attending: Emergency Medicine | Admitting: Emergency Medicine

## 2011-05-01 ENCOUNTER — Telehealth: Payer: Self-pay | Admitting: *Deleted

## 2011-05-01 NOTE — Telephone Encounter (Signed)
Patient came to office wanting refill on omeprazole.  Advised that Dr. Edmonia James sent in Rx for Pepcid in Feb. She states she has that but wants omeprazole also. States she has always taken two . Will send message to Dr. Edmonia James. Pharmacy is [facility].

## 2011-05-06 NOTE — Telephone Encounter (Signed)
Please let patient know that these are not supposed to be taken together.  I am glad to prescribe either pepcid or omeprazole (whichever works best for her)-  But she shouldn't take both at the same time.  Currently prescribed pepcid- if she would like to change to omeprazole please let me know and I will d/c pepcid and send in rx for omeprazole.  If symptoms are not well controlled on this medicine- pt should come in for recheck for this problem.

## 2011-05-06 NOTE — Telephone Encounter (Signed)
Patient notified. She has Pepcid and states this works fine . She has refills on this also.

## 2011-06-15 ENCOUNTER — Telehealth: Payer: Self-pay | Admitting: *Deleted

## 2011-06-15 NOTE — Telephone Encounter (Signed)
Patient comes to office stating the Pepcid is not working as well  and she wants to go back to omeprazole. Will send message to Dr. Edmonia James to please order new RX.  Please send back to me and I will call to [facility] pharmacy.

## 2011-06-17 ENCOUNTER — Other Ambulatory Visit: Payer: Self-pay | Admitting: Family Medicine

## 2011-06-17 DIAGNOSIS — K219 Gastro-esophageal reflux disease without esophagitis: Secondary | ICD-10-CM

## 2011-06-17 MED ORDER — OMEPRAZOLE 20 MG PO TBEC: 20.0000 mg | DELAYED_RELEASE_TABLET | Freq: Every day | ORAL | Status: DC

## 2011-06-17 NOTE — Telephone Encounter (Signed)
RX called to [facility] pharmacy. Patient notified to call for appointment in next 1-2 months and for CPE.

## 2011-06-17 NOTE — Telephone Encounter (Signed)
I have prescribed a couple months supply of omeprazole as patient requests.  Please print and fax to MAP program.  Pt. needs to set up an appointment in the next 1-2 months for further evaluation of this problem.  Also has not had a physical in the past year so is due for this.  Please have patient schedule an appointment.  Thanks, [employer]

## 2011-06-26 ENCOUNTER — Ambulatory Visit (INDEPENDENT_AMBULATORY_CARE_PROVIDER_SITE_OTHER): Payer: Self-pay | Admitting: Family Medicine

## 2011-06-26 ENCOUNTER — Other Ambulatory Visit (HOSPITAL_COMMUNITY)
Admission: RE | Admit: 2011-06-26 | Discharge: 2011-06-26 | Disposition: A | Discharge status: Home / Self Care | Payer: Self-pay | Source: Ambulatory Visit | Attending: Family Medicine | Admitting: Family Medicine

## 2011-06-26 ENCOUNTER — Encounter: Payer: Self-pay | Admitting: Family Medicine

## 2011-06-26 VITALS — BP 118/74 | HR 66 | Temp 98.1°F | Ht 63.0 in | Wt 149.3 lb

## 2011-06-26 DIAGNOSIS — Z01419 Encounter for gynecological examination (general) (routine) without abnormal findings: Secondary | ICD-10-CM | POA: Insufficient documentation

## 2011-06-26 DIAGNOSIS — H04203 Unspecified epiphora, bilateral lacrimal glands: Secondary | ICD-10-CM

## 2011-06-26 DIAGNOSIS — Z2089 Contact with and (suspected) exposure to other communicable diseases: Secondary | ICD-10-CM

## 2011-06-26 DIAGNOSIS — N898 Other specified noninflammatory disorders of vagina: Secondary | ICD-10-CM

## 2011-06-26 DIAGNOSIS — H04209 Unspecified epiphora, unspecified lacrimal gland: Secondary | ICD-10-CM

## 2011-06-26 DIAGNOSIS — Z0000 Encounter for general adult medical examination without abnormal findings: Secondary | ICD-10-CM

## 2011-06-26 DIAGNOSIS — Z124 Encounter for screening for malignant neoplasm of cervix: Secondary | ICD-10-CM

## 2011-06-26 DIAGNOSIS — Z202 Contact with and (suspected) exposure to infections with a predominantly sexual mode of transmission: Secondary | ICD-10-CM

## 2011-06-26 DIAGNOSIS — J45909 Unspecified asthma, uncomplicated: Secondary | ICD-10-CM

## 2011-06-26 DIAGNOSIS — N760 Acute vaginitis: Secondary | ICD-10-CM

## 2011-06-26 LAB — COMPREHENSIVE METABOLIC PANEL
ALT: 13 U/L (ref 0–35)
Alkaline Phosphatase: 40 U/L (ref 39–117)
CO2: 26 mEq/L (ref 19–32)
Sodium: 140 mEq/L (ref 135–145)
Total Bilirubin: 0.3 mg/dL (ref 0.3–1.2)
Total Protein: 6.5 g/dL (ref 6.0–8.3)

## 2011-06-26 LAB — LDL CHOLESTEROL, DIRECT: Direct LDL: 87 mg/dL

## 2011-06-26 LAB — POCT WET PREP (WET MOUNT)

## 2011-06-26 LAB — POCT GLYCOSYLATED HEMOGLOBIN (HGB A1C): Hemoglobin A1C: 5.5

## 2011-06-26 MED ORDER — BECLOMETHASONE DIPROPIONATE 40 MCG/ACT IN AERS: 2.0000 | INHALATION_SPRAY | Freq: Two times a day (BID) | RESPIRATORY_TRACT | Status: DC

## 2011-06-26 NOTE — Progress Notes (Signed)
  Subjective:    Patient ID: Tracey Kane, female    DOB: [birth year], 41 y.o.   MRN: [id number]  HPI  Patient here for routine physical:  PMH, Surgical history, Family History, allergies, medications and social history all updated under appropriate tabs.    Vaginal discharge: Reports foul-smelling discharge, yellow in color x6 months. Has to use a pad. Has not had a period since she had the IUD placed one year ago. Would like to have testing done to identify why she has vaginal discharge. Would like to be checked for STDs since her husband works away from home and is only home about 2 times per month. She is not 100% sure that he is monogamous.  Eyes watering: Patient reports watering in her eyes over one year. Seems to be worse when she is looking at the TV. Sometimes she has watering of her eyes at work. No known chemical exposures. . Nothing seems to make it better. No eye itching. Takes Claritin daily. No fever. No headache. No runny nose. No eye redness.  Asthma: Patient reports that she has wheezing and shortness of breath some months one to 2 times per month and other months an episode per week. Reports that she has had a chronic cough daily for her a few months now. Symptoms seem to worsen at night.no wheezing today. But does have shortness of breath at least once per week at night during the past month. Use albuterol inhaler as needed. Not using steroid inhaler. No fever. No recent viral illness.  Health maintenance: Patient agrees to Pap smear today. Recommended patient received pneumonia vaccine. Patient agrees to A1c screen for diabetes.-Positive family history Patient agrees to lipid panel and complete metabolic panel. STD screening requested by patient.  Smoking status reviewed.    Review of Systems    as per above. Objective:   Physical Exam  Constitutional: She appears well-developed and well-nourished.  HENT:  Head: Normocephalic and atraumatic.  Eyes: Pupils are  equal, round, and reactive to light. Right eye exhibits no discharge. Left eye exhibits no discharge.  Cardiovascular: Normal rate, regular rhythm and normal heart sounds.   No murmur heard. Pulmonary/Chest: Effort normal and breath sounds normal. No respiratory distress. She has no wheezes.  Abdominal: Soft. She exhibits no distension.  Genitourinary: Vagina normal. No breast swelling (no nodules or lumps bilateral), tenderness, discharge or bleeding. There is no rash, tenderness or lesion on the right labia. There is no rash, tenderness or lesion on the left labia. Cervix exhibits no motion tenderness, no discharge and no friability. Right adnexum displays no mass, no tenderness and no fullness. Left adnexum displays no mass, no tenderness and no fullness.  Musculoskeletal: She exhibits no edema.  Neurological: She is alert.  Skin: No rash noted.  Psychiatric: She has a normal mood and affect.          Assessment & Plan:

## 2011-06-26 NOTE — Patient Instructions (Addendum)
Labs: Yo voy a enviar los [employer].   Asthma: [place] el qvar como directado  Eye watering: Continue loratidine. fija una cita con la optomitrista (doctor de los ojos)  flujo de vagina: voy a llamar con los [facility]   Return in 1-2 months for recheck  For asthma.

## 2011-06-27 LAB — RPR

## 2011-06-28 DIAGNOSIS — N898 Other specified noninflammatory disorders of vagina: Secondary | ICD-10-CM | POA: Insufficient documentation

## 2011-06-28 DIAGNOSIS — Z0000 Encounter for general adult medical examination without abnormal findings: Secondary | ICD-10-CM | POA: Insufficient documentation

## 2011-06-28 DIAGNOSIS — H04203 Unspecified epiphora, bilateral lacrimal glands: Secondary | ICD-10-CM | POA: Insufficient documentation

## 2011-06-28 NOTE — Assessment & Plan Note (Signed)
Most likely 2/2 to seasonal allergies.  Pt to continue allergy medication.  Pt is due for appointment with eye MD-  Pt make sure to mention this problem to them so that they can examine patient for this as well.

## 2011-06-28 NOTE — Assessment & Plan Note (Signed)
Persistent cough, use of albuterol inhaler sometimes 1 x per week up to 3 x per week.  Will start pt on qvar to see if this gives some symptoms relief and decreases need for albuterol inhaler.  Hopefully will help with chronic cough.

## 2011-06-28 NOTE — Assessment & Plan Note (Signed)
Patient agrees to Pap smear today. Recommended patient received pneumonia vaccine. Patient agrees to A1c screen for diabetes.-Positive family history Patient agrees to lipid panel and complete metabolic panel. STD screening requested by patient.

## 2011-06-28 NOTE — Assessment & Plan Note (Signed)
Will obtain wet prep, GC/chlam test today.  Will also screen for HIV and RPR.

## 2011-07-02 ENCOUNTER — Encounter: Payer: Self-pay | Admitting: Family Medicine

## 2011-07-03 ENCOUNTER — Encounter: Payer: Self-pay | Admitting: Family Medicine

## 2011-07-06 ENCOUNTER — Telehealth: Payer: Self-pay | Admitting: Family Medicine

## 2011-07-06 NOTE — Telephone Encounter (Signed)
Called patient to review results.  Pt states that vaginal discharge has resolved.

## 2012-04-05 ENCOUNTER — Telehealth: Payer: Self-pay | Admitting: Family Medicine

## 2012-04-05 NOTE — Telephone Encounter (Signed)
Pt ask could the medicine Farmotodine for the stomach be sent to the pharmacy...she states that we should have [facility] as the pharmacy.

## 2012-04-05 NOTE — Telephone Encounter (Signed)
Marines, please call this patient to schedule an appointment with Dr Mauricio Po. He is the PCP and I don't see that he has ever seen her before. Her last visit here was with Dr Edmonia James over 9 months ago.Busick, Rodena Medin

## 2012-04-06 NOTE — Telephone Encounter (Signed)
I called Pt and LVM, hopefully pt will call us back.   MJ

## 2012-04-12 ENCOUNTER — Ambulatory Visit (INDEPENDENT_AMBULATORY_CARE_PROVIDER_SITE_OTHER): Payer: Self-pay | Admitting: Family Medicine

## 2012-04-12 ENCOUNTER — Encounter: Payer: Self-pay | Admitting: Family Medicine

## 2012-04-12 VITALS — BP 117/72 | HR 74 | Wt 160.0 lb

## 2012-04-12 DIAGNOSIS — K219 Gastro-esophageal reflux disease without esophagitis: Secondary | ICD-10-CM

## 2012-04-12 DIAGNOSIS — J45909 Unspecified asthma, uncomplicated: Secondary | ICD-10-CM

## 2012-04-12 MED ORDER — OMEPRAZOLE 20 MG PO TBEC: 20.0000 mg | DELAYED_RELEASE_TABLET | Freq: Every day | ORAL | Status: DC

## 2012-04-12 MED ORDER — ALBUTEROL SULFATE HFA 108 (90 BASE) MCG/ACT IN AERS: 2.0000 | INHALATION_SPRAY | Freq: Four times a day (QID) | RESPIRATORY_TRACT | Status: DC | PRN

## 2012-04-12 NOTE — Patient Instructions (Addendum)
Fue un Research officer, trade union.  Estoy dandole las recetas en forma de papel para la omeprazole y la bomba de albuterol.  Si va a haber una demora en renovar la tarjeta anaranjada en nuestra oficina, por favor vaya al 1100 a ver si le pueden calificar aparte para el programa MAP (Medication Assistance Program).  Si el reflujo no se controla con la omeprazole, por favor llame.  Igual, si el asma vuelve a darle problemas, posiblemente tendriamos que volver a ponerle en la Qvar para matenimiento del asma.

## 2012-04-12 NOTE — Assessment & Plan Note (Signed)
Has been off the Qvar for awhile without flares (last flare over 1 year ago).  Has also run out of albuterol HFA (son sprayed it all out of the [facility]).  Does not use, but we discussed that she should always have available (and keep meds out of reach of children).  For follow up of this in future visit.

## 2012-04-12 NOTE — Progress Notes (Signed)
  Subjective:    Patient ID: Ethelda Deangelo, female    DOB: [birth year], 42 y.o.   MRN: [id number]  HPI Visit conducted in Spanish.  Kinnedy comes in for assessment of her GERD, which has been a longstanding problem for several years.  Resolves when she is on omeprazole.  She has run out and is here to discuss.  No unexplained weight loss; no fevers or chills.  No vomiting but does have constipation when she runs out of her PPI.  She does not have blood in her stool.  No alcohol use, never-smoker.    Triggers for GERD include coffee, tomato, citrus.   2.  Also for follow up of asthma; has stopped using Qvar because she has not had a flare. Does not identify her triggers.  Has been over a year since she felt need for a rescue inhaler.  Children wasted her albuterol HFA and she is without (although does not need to use).   Review of Systems See above ROS in HPI.     Objective:   Physical Exam Well appearing, no apparent distress HEENT neck supple. No cervical adenopathy. Clear oropharynx. COR regular S1S2 PULM Clear bilat, no wheezes or rales.  ABD Soft, nontender, nondistended.  No masses or organomegaly.        Assessment & Plan:

## 2012-04-12 NOTE — Assessment & Plan Note (Signed)
Long-standing GERD, without N/V, no unexplained weight loss, no fevers/chills.  She has done well on omeprazole 20mg  daily, would like refill from [facility] pharmacy.  Is re-applying for [employer] (but may apply for MAP assistance if long delay in reapplying for [employer] through [place]).   To avoid dietary triggers such as citrus, tomato sauce, coffee, spicy food.  She reports that she had a H Pylori test in the past which was positive, and that she completed the treatment for this but does not recall if she had follow up testing.  Will defer for now.  Refill of omeprazole and follow up if not better.

## 2012-04-15 ENCOUNTER — Other Ambulatory Visit: Payer: Self-pay | Admitting: Family Medicine

## 2012-04-15 MED ORDER — FAMOTIDINE 20 MG PO TABS: 20.0000 mg | ORAL_TABLET | Freq: Two times a day (BID) | ORAL | Status: DC

## 2013-04-06 ENCOUNTER — Ambulatory Visit: Payer: Self-pay | Admitting: Family Medicine

## 2013-04-10 ENCOUNTER — Ambulatory Visit (INDEPENDENT_AMBULATORY_CARE_PROVIDER_SITE_OTHER): Payer: Self-pay | Admitting: Emergency Medicine

## 2013-04-10 ENCOUNTER — Encounter: Payer: Self-pay | Admitting: Emergency Medicine

## 2013-04-10 VITALS — BP 123/79 | HR 85 | Temp 98.1°F | Ht 63.0 in | Wt 154.0 lb

## 2013-04-10 DIAGNOSIS — J45909 Unspecified asthma, uncomplicated: Secondary | ICD-10-CM

## 2013-04-10 MED ORDER — ALBUTEROL SULFATE HFA 108 (90 BASE) MCG/ACT IN AERS: 2.0000 | INHALATION_SPRAY | Freq: Four times a day (QID) | RESPIRATORY_TRACT | Status: DC | PRN

## 2013-04-10 NOTE — Assessment & Plan Note (Addendum)
Exacerbation likely from allergies vs URI. No fevers to suggest PNA. Prescription for albuterol given. If not improving in 1 week, please come back.

## 2013-04-10 NOTE — Patient Instructions (Signed)
It was nice to meet you!  I think your asthma is flared up from allergies or a cold.  Use the albuterol as needed for shortness of breath or wheezing.  If you are not doing better in 1 week, please come back.

## 2013-04-10 NOTE — Progress Notes (Signed)
   Subjective:    Patient ID: Tracey Kane, female    DOB: March 21, 1970, 43 y.o.   MRN: [id number]  HPI Tracey Kane is here for a SDA for asthma flare.  She states that for the last 2 weeks she has had some shortness of breath and cough.  She has been using a friend's albuterol with good improvement, as she does not have any refills for her own albuterol.  Prior to this episode, she had not used albuterol for several months.  No rhinorrhea/nasal congestion or eye symptoms.  No fevers.  No wheezing.    Known history of intermittent asthma.  Current Outpatient Prescriptions on File Prior to Visit  Medication Sig Dispense Refill  . beclomethasone (QVAR) 40 MCG/ACT inhaler Inhale 2 puffs into the lungs 2 (two) times daily.  1 Inhaler  6  . beclomethasone (QVAR) 80 MCG/ACT inhaler Inhale 1 puff into the lungs 2 (two) times daily.  1 Inhaler  12  . famotidine (PEPCID) 20 MG tablet Take 1 tablet (20 mg total) by mouth 2 (two) times daily.  60 tablet  6  . loratadine (CLARITIN) 10 MG tablet Take 10 mg by mouth daily. Please print label in spanish       . Omeprazole 20 MG TBEC Take 1 tablet (20 mg total) by mouth daily.  90 tablet  3   No current facility-administered medications on file prior to visit.    I have reviewed and updated the following as appropriate: allergies and current medications SHx: non smoker   Review of Systems See HPI    Objective:   Physical Exam BP 123/79  Pulse 85  Temp(Src) 98.1 F (36.7 C) (Oral)  Ht 5\' 3"  (1.6 m)  Wt 154 lb (69.854 kg)  BMI 27.29 kg/m2  SpO2 97% Gen: alert, cooperative, NAD HEENT: AT/[place], sclera white, MMM Neck: supple, no LAD CV: RRR, no murmurs Pulm: CTAB, no wheezes or rales      Assessment & Plan:

## 2014-02-07 ENCOUNTER — Ambulatory Visit (INDEPENDENT_AMBULATORY_CARE_PROVIDER_SITE_OTHER): Payer: Self-pay | Admitting: Obstetrics & Gynecology

## 2014-02-07 ENCOUNTER — Encounter: Payer: Self-pay | Admitting: Obstetrics & Gynecology

## 2014-02-07 ENCOUNTER — Encounter: Payer: Self-pay | Admitting: *Deleted

## 2014-02-07 VITALS — BP 122/70 | HR 67 | Temp 98.0°F | Wt 153.6 lb

## 2014-02-07 DIAGNOSIS — Z30432 Encounter for removal of intrauterine contraceptive device: Secondary | ICD-10-CM

## 2014-02-07 NOTE — Progress Notes (Signed)
Patient ID: Tracey Kane, female   DOB: 02-Aug-1970, 44 y.o.   MRN: [id number] Patient was in the dorsal lithotomy position, normal external genitalia was noted.  A speculum was placed in the patient's vagina, normal discharge was noted, no lesions. The multiparous cervix was visualized, no lesions, no abnormal discharge. The strings of the IUD were not visualized, so Kelly forceps were introduced into the endocervical canal and the IUD was grasped and removed in its entirety.   The IUD was successfully removed in its entirety.  Patient tolerated the procedure well.  she was reminded not to be sexually active until her Nexplanon was placed at the Health department.  Joyce Heitman L. Harraway-Smith, M.D., Evern CoreFACOG

## 2014-02-07 NOTE — Progress Notes (Signed)
Tracey Kane used as interpreter for this encounter.

## 2014-02-07 NOTE — Patient Instructions (Signed)
Eleccin del mtodo anticonceptivo (Contraception Choices) La anticoncepcin (control de la natalidad) es el uso de cualquier mtodo o dispositivo para evitar el embarazo. A continuacin se indican algunos de esos mtodos. MTODOS HORMONALES   El Implante contraconceptivo consiste en un tubo plstico delgado que contiene la hormona progesterona. No contiene estrgenos. El mdico inserta el tubo en la parte interna del brazo. El tubo puede permanecer en el lugar durante 3 aos. Despus de los 3 aos debe retirarse. El implante impide que los ovarios liberen vulos (ovulacin), espesa el moco cervical, lo que evita que los espermatozoides ingresen al tero y hace ms delgada la membrana que cubre el interior del tero.  Inyecciones de progesterona sola: las administra el mdico cada 3 meses para evitar el embarazo. La progesterona sinttica impide que los ovarios liberen vulos. Tambin hacen que el moco cervical se espese y modifique el tejido de recubrimiento interno del tero. Esto hace ms difcil que los espermatozoides sobrevivan en el tero.  Las pldoras anticonceptivas contienen estrgenos y progesterona. Su funcin es evitar que los ovarios liberen vulos (ovulacin). Las hormonas de los anticonceptivos orales hacen que el moco cervical se haga ms espeso, lo que evita que el esperma ingrese al tero. Las pldoras anticonceptivas son recetadas por el mdico.Tambin se utilizan para tratar los perodos menstruales abundantes.  Minipldora: este tipo de pldora anticonceptiva contiene slo hormona progesterona. Deben tomarse todos los das del mes y debe recetarlas el mdico.  El parche de control de natalidad: contiene hormonas similares a las que contienen las pldoras anticonceptivas. Deben cambiarse una vez por semana y se utilizan bajo prescripcin mdica.  Anillo vaginal: contiene hormonas similares a las que contienen las pldoras anticonceptivas. Se deja colocado durante tres semanas,  se lo retira durante 1 semana y luego se coloca uno nuevo. La paciente debe sentirse cmoda al insertar y retirar el anillo de la vagina.Es necesaria la prescripcin mdica.  Anticonceptivos de emergencia: son mtodos para evitar un embarazo despus de una relacin sexual sin proteccin. Esta pldora puede tomarse inmediatamente despus de tener relaciones sexuales o hasta 5 das de haber tenido sexo sin proteccin. Es ms efectiva si se toma poco tiempo despus de la relacin sexual. Los anticonceptivos de emergencia estn disponibles sin prescripcin mdica. Consltelo con su farmacutico. No use los anticonceptivos de emergencia como nico mtodo anticonceptivo. MTODOS DE BARRERA   Condn masculino: es una vaina delgada (ltex o goma) que se coloca cubriendo al pene durante el acto sexual. Puede usarse con espermicida para aumentar la efectividad.  Condn femenino. Es una funda delicada y blanda que se adapta holgadamente a la vagina antes de las relaciones sexuales.  Diafragma: es una barrera de ltex redonda y suave que debe ser recomendado por un profesional. Se inserta en la vagina, junto con un gel espermicida. Debe insertarse antes de tener relaciones sexuales. Debe dejar el diafragma colocado en la vagina durante 6 a 8 horas despus de la relacin sexual.  Capuchn cervical: es una barrera de ltex o taza plstica redonda y suave que cubre el cuello del tero y debe ser colocada por un mdico. Puede dejarlo colocado en la vagina hasta 48 horas despus de las relaciones sexuales.  Esponja: es una pieza blanda y circular de espuma de poliuretano. Contiene un espermicida. Se inserta en la vagina despus de mojarla y antes de las relaciones sexuales.  Espermicidas: son sustancias qumicas que matan o bloquean al esperma y no lo dejan ingresar al cuello del tero y al tero. Vienen   en forma de cremas, geles, supositorios, espuma o comprimidos. No es necesario tener receta mdica. Se insertan en  la vagina con un aplicador antes de tener relaciones sexuales. El proceso debe repetirse cada vez que tiene relaciones sexuales. ANTICONCEPTIVOS INTRAUTERINOS  Dispositivo intrauterino (DIU) es un dispositivo en forma de T que se coloca en el tero durante el perodo menstrual, para evitar el embarazo. Hay dos tipos:  DIU de cobre: este tipo de DIU est recubierto con un alambre de cobre y se inserta dentro del tero. El cobre hace que el tero y las trompas de Falopio produzcan un liquido que destruye los espermatozoides. Puede permanecer colocado durante 10 aos.  DIU con hormona: este tipo de DIU contiene la hormona progestina (progesterona sinttica). La hormona espesa el moco cervical y evita que los espermatozoides ingresen al tero y tambin afina la membrana que cubre el tero para evitar la implantacin del vulo fertilizado. La hormona debilita o destruye los espermatozoides que ingresan al tero. Puede permanecer en el lugar durante 3-5 aos, segn el tipo de DIU que se utilice. MTODOS ANTICONCEPTIVOS PERMANENTES  Ligadura de trompas en la mujer: se realiza sellando, atando u obstruyendo quirrgicamente las trompas de Falopio lo que impide que el vulo descienda hacia el tero.  Esterilizacin histeroscpica: Implica la colocacin de un pequeo espiral o la insercin en cada trompa de Falopio. El mdico utiliza una tcnica llamada histeroscopa para realizar este procedimiento. El dispositivo produce la formacin de tejido cicatrizal. Esto da como resultado una obstruccin permanente de las trompas de Falopio, de modo que la esperma no pueda fertilizar el vulo. Demora alrededor de 3 meses despus del procedimiento hasta que el conducto se obstruye. Tendr que usar otro mtodo anticonceptivo durante al menos 3 meses.  Esterilizacin masculina: se realiza ligando los conductos por los que pasan los espermatozoides (vasectoma).Esto impide que el esperma ingrese a la vagina durante el acto  sexual. Luego del procedimiento, el hombre puede eyacular lquido (semen). MTODOS DE PLANIFICACIN NATURAL  Planificacin familiar natural: consiste en no tener relaciones sexuales o usar un mtodo de barrera (condn, diafragma, capuchn cervical) en los das que la mujer podra quedar embarazada.  Mtodo de calendario: consiste en el seguimiento de la duracin de cada ciclo menstrual y la identificacin de los perodos frtiles.  Mtodo de ovulacin: consiste en evitar las relaciones sexuales durante la ovulacin.  Mtodo sintotrmico: consiste en evitar las relaciones sexuales en la poca en la que se est ovulando, utilizando un termmetro y tendiendo en cuenta los sntomas de la ovulacin.  Mtodo postovulacin: consiste en planificar las relaciones sexuales para despus de haber ovulado. Independientemente del tipo o mtodo anticonceptivo que usted elija, es importante que use condones para protegerse contra las infecciones de transmisin sexual (ETS). Hable con su mdico con respecto a qu mtodo anticonceptivo es el ms apropiado para usted. Document Released: 12/22/2004 Document Revised: 08/24/2012 ExitCare Patient Information 2015 [employer]. This information is not intended to replace advice given to you by your health care provider. Make sure you discuss any questions you have with your health care provider.  

## 2014-05-24 ENCOUNTER — Ambulatory Visit: Payer: Self-pay | Admitting: Family Medicine

## 2015-12-15 ENCOUNTER — Emergency Department (HOSPITAL_COMMUNITY): Payer: Self-pay

## 2015-12-15 ENCOUNTER — Emergency Department (HOSPITAL_COMMUNITY)
Admission: EM | Admit: 2015-12-15 | Discharge: 2015-12-15 | Disposition: A | Discharge status: Home / Self Care | Payer: Self-pay | Attending: Emergency Medicine | Admitting: Emergency Medicine

## 2015-12-15 ENCOUNTER — Encounter (HOSPITAL_COMMUNITY): Payer: Self-pay | Admitting: *Deleted

## 2015-12-15 DIAGNOSIS — Y990 Civilian activity done for income or pay: Secondary | ICD-10-CM | POA: Insufficient documentation

## 2015-12-15 DIAGNOSIS — W231XXA Caught, crushed, jammed, or pinched between stationary objects, initial encounter: Secondary | ICD-10-CM | POA: Insufficient documentation

## 2015-12-15 DIAGNOSIS — Y929 Unspecified place or not applicable: Secondary | ICD-10-CM | POA: Insufficient documentation

## 2015-12-15 DIAGNOSIS — S5012XA Contusion of left forearm, initial encounter: Secondary | ICD-10-CM | POA: Insufficient documentation

## 2015-12-15 DIAGNOSIS — Y939 Activity, unspecified: Secondary | ICD-10-CM | POA: Insufficient documentation

## 2015-12-15 DIAGNOSIS — J45909 Unspecified asthma, uncomplicated: Secondary | ICD-10-CM | POA: Insufficient documentation

## 2015-12-15 MED ORDER — IBUPROFEN 800 MG PO TABS: 800.0000 mg | ORAL_TABLET | Freq: Three times a day (TID) | ORAL | 0 refills | Status: DC | PRN

## 2015-12-15 NOTE — ED Triage Notes (Signed)
Pt reports hitting left arm on door at work. Now has pain to entire arm from hand up to her neck. No obv swelling or injury noted.

## 2015-12-15 NOTE — ED Provider Notes (Signed)
[facility]-EMERGENCY DEPT Provider Note   CSN: [id number] Arrival date & time: 12/15/15  [id number]  By signing my name below, I, Soijett Blue, attest that this documentation has been prepared under the direction and in the presence of Ebbie Ridgehris Teo Moede, PA-C Electronically Signed: Soijett Blue, ED Scribe. 12/15/15. 11:13 AM.  History   Chief Complaint Chief Complaint  Patient presents with  . Arm Injury    HPI Tracey Kane is a 45 y.o. female who presents to the Emergency Department complaining of left forearm injury occurring 4 AM this morning. Pt notes that she was ambulating when her left forearm was struck and caught in the door.  Pt is having associated symptoms of left index finger pain, tingling to left forearm, bruising to left forearm, and swelling to left forearm. She notes that she has not tried any medications for the relief of her symptoms. She denies wound and any other symptoms.    The history is provided by the patient. No language interpreter was used.    Past Medical History:  Diagnosis Date  . Asthma     Patient Active Problem List   Diagnosis Date Noted  . Ulcer of toe of left foot (HCC) 06/10/2010  . 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 ASTHMA, INTERMITTENT, MODERATE 11/22/2006  . GASTROESOPHAGEAL REFLUX DISEASE 10/20/2006    Past Surgical History:  Procedure Laterality Date  . knee sx      OB History    No data available       Home Medications    Prior to Admission medications   Medication Sig Start Date End Date Taking? Authorizing Provider  albuterol (PROVENTIL HFA;VENTOLIN HFA) 108 (90 BASE) MCG/ACT inhaler Inhale 2 puffs into the lungs every 6 (six) hours as needed for wheezing or shortness of breath. 04/10/13   Charm RingsErin J Honig, MD  famotidine (PEPCID) 20 MG tablet Take 1 tablet (20 mg total) by mouth 2 (two) times daily. Patient not taking: Reported on 02/07/2014 04/15/12   Barbaraann BarthelJames O Breen, MD  loratadine (CLARITIN) 10 MG tablet Take 10  mg by mouth daily. Please print label in spanish     Historical Provider, MD  Omeprazole 20 MG TBEC Take 1 tablet (20 mg total) by mouth daily. 04/12/12   Barbaraann BarthelJames O Breen, MD    Family History Family History  Problem Relation Age of Onset  . Heart disease Father   . Hyperlipidemia Father   . Hypertension Father   . Diabetes Father     Social History Social History  Substance Use Topics  . Smoking status: Never Smoker  . Smokeless tobacco: Not on file  . Alcohol use No     Allergies   Azithromycin and Norgestimate-eth estradiol   Review of Systems Review of Systems  Musculoskeletal: Positive for arthralgias (left forearm) and joint swelling (left forearm).  Skin: Positive for color change (bruising to left forearm). Negative for wound.  Neurological:       Tingling to left forearm     Physical Exam Updated Vital Signs BP 118/74 (BP Location: Right Arm)   Pulse 80   Temp 97.6 F (36.4 C) (Oral)   Resp 12   Ht 5\' 3"  (1.6 m)   Wt 146 lb (66.2 kg)   SpO2 100%   BMI 25.86 kg/m   Physical Exam  Constitutional: She is oriented to person, place, and time. She appears well-developed and well-nourished. No distress.  HENT:  Head: Normocephalic and atraumatic.  Eyes: Pupils are  equal, round, and reactive to light.  Pulmonary/Chest: Effort normal.  Musculoskeletal:       Left forearm: She exhibits swelling.  Swelling and bruising noted to left lateral proximal forearm. Good strength and good ROM. Nl sensation.   Neurological: She is alert and oriented to person, place, and time.  Skin: Skin is warm and dry.  Psychiatric: She has a normal mood and affect.  Nursing note and vitals reviewed.    ED Treatments / Results  DIAGNOSTIC STUDIES: Oxygen Saturation is 100% on RA, nl by my interpretation.    COORDINATION OF CARE: 11:10 AM Discussed treatment plan with pt at bedside which includes left forearm xray and pt agreed to plan.   Radiology Dg Forearm Left  Result  Date: 12/15/2015 CLINICAL DATA:  Hit left arm on door at work today with pain/swelling over posterior forearm. EXAM: LEFT FOREARM - 2 VIEW COMPARISON:  None. FINDINGS: There is no evidence of fracture or other focal bone lesions. Soft tissues are unremarkable. IMPRESSION: Negative. Electronically Signed   By: Elberta Fortisaniel  Boyle M.D.   On: 12/15/2015 11:55    Procedures Procedures (including critical care time)  Medications Ordered in ED Medications - No data to display   Initial Impression / Assessment and Plan / ED Course  I have reviewed the triage vital signs and the nursing notes.  Pertinent imaging results that were available during my care of the patient were reviewed by me and considered in my medical decision making (see chart for details).  Clinical Course     Patient be treated for contusion of left elbow.  Told to return here as needed.  Patient given an Ace wrap to help with her primary care doctor  Final Clinical Impressions(s) / ED Diagnoses   Final diagnoses:  None    New Prescriptions New Prescriptions   No medications on file  I personally performed the services described in this documentation, which was scribed in my presence. The recorded information has been reviewed and is accurate.   Charlestine NightChristopher Kayleah Appleyard, PA-C 12/19/15 0251    Lorre NickAnthony Allen, MD 12/21/15 1024

## 2015-12-15 NOTE — Discharge Instructions (Signed)
The x-rays were normal.  Return here as needed.  Ice and elevate the arm

## 2015-12-15 NOTE — ED Notes (Signed)
Ace wrap applied

## 2016-04-01 ENCOUNTER — Encounter (HOSPITAL_COMMUNITY): Payer: Self-pay | Admitting: Emergency Medicine

## 2016-04-01 DIAGNOSIS — R1011 Right upper quadrant pain: Secondary | ICD-10-CM | POA: Insufficient documentation

## 2016-04-01 DIAGNOSIS — R102 Pelvic and perineal pain: Secondary | ICD-10-CM | POA: Insufficient documentation

## 2016-04-01 DIAGNOSIS — Z23 Encounter for immunization: Secondary | ICD-10-CM | POA: Insufficient documentation

## 2016-04-01 DIAGNOSIS — K37 Unspecified appendicitis: Principal | ICD-10-CM | POA: Insufficient documentation

## 2016-04-01 DIAGNOSIS — J45909 Unspecified asthma, uncomplicated: Secondary | ICD-10-CM | POA: Insufficient documentation

## 2016-04-01 DIAGNOSIS — Z791 Long term (current) use of non-steroidal anti-inflammatories (NSAID): Secondary | ICD-10-CM | POA: Insufficient documentation

## 2016-04-01 DIAGNOSIS — Z881 Allergy status to other antibiotic agents status: Secondary | ICD-10-CM | POA: Insufficient documentation

## 2016-04-01 LAB — CBC
HCT: 41.6 % (ref 36.0–46.0)
HEMOGLOBIN: 13.7 g/dL (ref 12.0–15.0)
MCH: 29.6 pg (ref 26.0–34.0)
MCHC: 32.9 g/dL (ref 30.0–36.0)
MCV: 89.8 fL (ref 78.0–100.0)
Platelets: 192 10*3/uL (ref 150–400)
RBC: 4.63 MIL/uL (ref 3.87–5.11)
RDW: 13.7 % (ref 11.5–15.5)
WBC: 11.2 10*3/uL — ABNORMAL HIGH (ref 4.0–10.5)

## 2016-04-01 LAB — URINALYSIS, ROUTINE W REFLEX MICROSCOPIC
Bilirubin Urine: NEGATIVE
GLUCOSE, UA: NEGATIVE mg/dL
KETONES UR: 5 mg/dL — AB
LEUKOCYTES UA: NEGATIVE
NITRITE: NEGATIVE
PROTEIN: NEGATIVE mg/dL
Specific Gravity, Urine: 1.023 (ref 1.005–1.030)
pH: 5 (ref 5.0–8.0)

## 2016-04-01 LAB — COMPREHENSIVE METABOLIC PANEL
ALK PHOS: 48 U/L (ref 38–126)
ALT: 18 U/L (ref 14–54)
ANION GAP: 11 (ref 5–15)
AST: 20 U/L (ref 15–41)
Albumin: 4.4 g/dL (ref 3.5–5.0)
BILIRUBIN TOTAL: 0.5 mg/dL (ref 0.3–1.2)
BUN: 12 mg/dL (ref 6–20)
CALCIUM: 9.5 mg/dL (ref 8.9–10.3)
CO2: 24 mmol/L (ref 22–32)
Chloride: 102 mmol/L (ref 101–111)
Creatinine, Ser: 0.66 mg/dL (ref 0.44–1.00)
GFR calc non Af Amer: >60 mL/min (ref >60)
Glucose, Bld: 111 mg/dL — ABNORMAL HIGH (ref 65–99)
Potassium: 3.7 mmol/L (ref 3.5–5.1)
SODIUM: 137 mmol/L (ref 135–145)
TOTAL PROTEIN: 7.5 g/dL (ref 6.5–8.1)

## 2016-04-01 LAB — LIPASE, BLOOD: Lipase: 17 U/L (ref 11–51)

## 2016-04-01 NOTE — ED Triage Notes (Signed)
Patient reports generalized abdominal pain with emesis onset this morning , denies diarrhea , no fever or chills.  

## 2016-04-02 ENCOUNTER — Observation Stay (HOSPITAL_COMMUNITY)
Admission: EM | Admit: 2016-04-02 | Discharge: 2016-04-03 | Disposition: A | Discharge status: Home / Self Care | Payer: Medicaid Other | Attending: Surgery | Admitting: Surgery

## 2016-04-02 ENCOUNTER — Emergency Department (HOSPITAL_COMMUNITY): Payer: Medicaid Other

## 2016-04-02 ENCOUNTER — Observation Stay (HOSPITAL_COMMUNITY): Payer: Medicaid Other | Admitting: Certified Registered Nurse Anesthetist

## 2016-04-02 ENCOUNTER — Encounter (HOSPITAL_COMMUNITY)
Admission: EM | Disposition: A | Discharge status: Home / Self Care | Payer: Self-pay | Source: Home / Self Care | Attending: Emergency Medicine

## 2016-04-02 DIAGNOSIS — K37 Unspecified appendicitis: Secondary | ICD-10-CM | POA: Diagnosis present

## 2016-04-02 DIAGNOSIS — Z23 Encounter for immunization: Secondary | ICD-10-CM | POA: Diagnosis not present

## 2016-04-02 DIAGNOSIS — R102 Pelvic and perineal pain: Secondary | ICD-10-CM

## 2016-04-02 DIAGNOSIS — R1031 Right lower quadrant pain: Secondary | ICD-10-CM

## 2016-04-02 DIAGNOSIS — R1011 Right upper quadrant pain: Secondary | ICD-10-CM | POA: Diagnosis not present

## 2016-04-02 HISTORY — PX: LAPAROSCOPIC APPENDECTOMY: SHX408

## 2016-04-02 LAB — HIV ANTIBODY (ROUTINE TESTING W REFLEX): HIV SCREEN 4TH GENERATION: NONREACTIVE

## 2016-04-02 LAB — I-STAT BETA HCG BLOOD, ED (MC, WL, AP ONLY)

## 2016-04-02 SURGERY — APPENDECTOMY, LAPAROSCOPIC
Anesthesia: General | Site: Abdomen

## 2016-04-02 MED ORDER — ONDANSETRON HCL 4 MG/2ML IJ SOLN
INTRAMUSCULAR | Status: AC
  Administered 2016-04-02: 4 mg
  Filled 2016-04-02: qty 2

## 2016-04-02 MED ORDER — DIPHENHYDRAMINE HCL 12.5 MG/5ML PO ELIX: 12.5000 mg | ORAL_SOLUTION | Freq: Four times a day (QID) | ORAL | Status: DC | PRN

## 2016-04-02 MED ORDER — DEXAMETHASONE SODIUM PHOSPHATE 10 MG/ML IJ SOLN
INTRAMUSCULAR | Status: DC | PRN
  Administered 2016-04-02: 10 mg via INTRAVENOUS

## 2016-04-02 MED ORDER — ONDANSETRON HCL 4 MG/2ML IJ SOLN
INTRAMUSCULAR | Status: AC
  Filled 2016-04-02: qty 2

## 2016-04-02 MED ORDER — METRONIDAZOLE IN NACL 5-0.79 MG/ML-% IV SOLN
500.0000 mg | Freq: Three times a day (TID) | INTRAVENOUS | Status: DC
  Administered 2016-04-02 – 2016-04-03 (×4): 500 mg via INTRAVENOUS
  Filled 2016-04-02 (×5): qty 100

## 2016-04-02 MED ORDER — DIPHENHYDRAMINE HCL 50 MG/ML IJ SOLN: 12.5000 mg | Freq: Four times a day (QID) | INTRAMUSCULAR | Status: DC | PRN

## 2016-04-02 MED ORDER — HYDROMORPHONE HCL 1 MG/ML IJ SOLN: 0.5000 mg | INTRAMUSCULAR | Status: DC | PRN

## 2016-04-02 MED ORDER — LACTATED RINGERS IV SOLN
INTRAVENOUS | Status: DC
  Administered 2016-04-02 (×2): via INTRAVENOUS

## 2016-04-02 MED ORDER — ONDANSETRON HCL 4 MG/2ML IJ SOLN
4.0000 mg | Freq: Once | INTRAMUSCULAR | Status: AC
  Administered 2016-04-02: 4 mg via INTRAVENOUS
  Filled 2016-04-02: qty 2

## 2016-04-02 MED ORDER — DOCUSATE SODIUM 100 MG PO CAPS
100.0000 mg | ORAL_CAPSULE | Freq: Two times a day (BID) | ORAL | Status: DC
  Administered 2016-04-02 – 2016-04-03 (×3): 100 mg via ORAL
  Filled 2016-04-02 (×3): qty 1

## 2016-04-02 MED ORDER — ONDANSETRON HCL 4 MG/2ML IJ SOLN
INTRAMUSCULAR | Status: DC | PRN
  Administered 2016-04-02: 4 mg via INTRAVENOUS

## 2016-04-02 MED ORDER — 0.9 % SODIUM CHLORIDE (POUR BTL) OPTIME
TOPICAL | Status: DC | PRN
  Administered 2016-04-02: 1000 mL

## 2016-04-02 MED ORDER — ROCURONIUM BROMIDE 100 MG/10ML IV SOLN
INTRAVENOUS | Status: DC | PRN
  Administered 2016-04-02: 40 mg via INTRAVENOUS

## 2016-04-02 MED ORDER — ONDANSETRON 4 MG PO TBDP: 4.0000 mg | ORAL_TABLET | Freq: Four times a day (QID) | ORAL | Status: DC | PRN

## 2016-04-02 MED ORDER — ROCURONIUM BROMIDE 50 MG/5ML IV SOSY
PREFILLED_SYRINGE | INTRAVENOUS | Status: AC
  Filled 2016-04-02: qty 5

## 2016-04-02 MED ORDER — DEXTROSE 5 % IV SOLN
2.0000 g | INTRAVENOUS | Status: DC
  Administered 2016-04-02 – 2016-04-03 (×2): 2 g via INTRAVENOUS
  Filled 2016-04-02 (×2): qty 2

## 2016-04-02 MED ORDER — SUGAMMADEX SODIUM 200 MG/2ML IV SOLN
INTRAVENOUS | Status: AC
  Filled 2016-04-02: qty 2

## 2016-04-02 MED ORDER — ENOXAPARIN SODIUM 40 MG/0.4ML ~~LOC~~ SOLN
40.0000 mg | SUBCUTANEOUS | Status: DC
  Administered 2016-04-02: 40 mg via SUBCUTANEOUS
  Filled 2016-04-02 (×2): qty 0.4

## 2016-04-02 MED ORDER — ACETAMINOPHEN 325 MG PO TABS: 650.0000 mg | ORAL_TABLET | Freq: Four times a day (QID) | ORAL | Status: DC | PRN

## 2016-04-02 MED ORDER — PROPOFOL 10 MG/ML IV BOLUS
INTRAVENOUS | Status: AC
  Filled 2016-04-02: qty 20

## 2016-04-02 MED ORDER — ONDANSETRON 4 MG PO TBDP
8.0000 mg | ORAL_TABLET | Freq: Once | ORAL | Status: AC
  Administered 2016-04-02: 8 mg via ORAL
  Filled 2016-04-02: qty 2

## 2016-04-02 MED ORDER — PROPOFOL 10 MG/ML IV BOLUS
INTRAVENOUS | Status: DC | PRN
  Administered 2016-04-02: 130 mg via INTRAVENOUS

## 2016-04-02 MED ORDER — MIDAZOLAM HCL 2 MG/2ML IJ SOLN
INTRAMUSCULAR | Status: AC
  Filled 2016-04-02: qty 2

## 2016-04-02 MED ORDER — SODIUM CHLORIDE 0.9 % IR SOLN
Status: DC | PRN
  Administered 2016-04-02: 1000 mL

## 2016-04-02 MED ORDER — ACETAMINOPHEN 650 MG RE SUPP: 650.0000 mg | Freq: Four times a day (QID) | RECTAL | Status: DC | PRN

## 2016-04-02 MED ORDER — HYDROCODONE-ACETAMINOPHEN 5-325 MG PO TABS
1.0000 | ORAL_TABLET | Freq: Once | ORAL | Status: AC
  Administered 2016-04-02: 1 via ORAL
  Filled 2016-04-02: qty 1

## 2016-04-02 MED ORDER — OXYCODONE HCL 5 MG PO TABS
5.0000 mg | ORAL_TABLET | Freq: Four times a day (QID) | ORAL | Status: DC | PRN
  Administered 2016-04-02: 5 mg via ORAL
  Filled 2016-04-02 (×2): qty 1

## 2016-04-02 MED ORDER — BUPIVACAINE HCL 0.25 % IJ SOLN
INTRAMUSCULAR | Status: DC | PRN
  Administered 2016-04-02: 10 mL

## 2016-04-02 MED ORDER — MIDAZOLAM HCL 2 MG/2ML IJ SOLN
INTRAMUSCULAR | Status: DC | PRN
  Administered 2016-04-02: 2 mg via INTRAVENOUS

## 2016-04-02 MED ORDER — ONDANSETRON HCL 4 MG/2ML IJ SOLN: 4.0000 mg | Freq: Four times a day (QID) | INTRAMUSCULAR | Status: DC | PRN

## 2016-04-02 MED ORDER — FENTANYL CITRATE (PF) 100 MCG/2ML IJ SOLN
50.0000 ug | Freq: Once | INTRAMUSCULAR | Status: AC
  Administered 2016-04-02: 50 ug via INTRAVENOUS
  Filled 2016-04-02: qty 2

## 2016-04-02 MED ORDER — IOPAMIDOL (ISOVUE-300) INJECTION 61%
INTRAVENOUS | Status: AC
  Administered 2016-04-02: 100 mL
  Filled 2016-04-02: qty 100

## 2016-04-02 MED ORDER — OXYCODONE HCL 5 MG PO TABS
5.0000 mg | ORAL_TABLET | ORAL | Status: DC | PRN
  Administered 2016-04-02: 5 mg via ORAL
  Filled 2016-04-02: qty 2

## 2016-04-02 MED ORDER — LIDOCAINE 2% (20 MG/ML) 5 ML SYRINGE
INTRAMUSCULAR | Status: AC
  Filled 2016-04-02: qty 5

## 2016-04-02 MED ORDER — FENTANYL CITRATE (PF) 100 MCG/2ML IJ SOLN
INTRAMUSCULAR | Status: DC | PRN
  Administered 2016-04-02: 100 ug via INTRAVENOUS
  Administered 2016-04-02: 150 ug via INTRAVENOUS

## 2016-04-02 MED ORDER — SUGAMMADEX SODIUM 200 MG/2ML IV SOLN
INTRAVENOUS | Status: DC | PRN
  Administered 2016-04-02: 140 mg via INTRAVENOUS

## 2016-04-02 MED ORDER — IBUPROFEN 600 MG PO TABS
600.0000 mg | ORAL_TABLET | Freq: Four times a day (QID) | ORAL | Status: DC | PRN
  Administered 2016-04-02: 600 mg via ORAL
  Filled 2016-04-02: qty 1

## 2016-04-02 MED ORDER — LIDOCAINE 2% (20 MG/ML) 5 ML SYRINGE
INTRAMUSCULAR | Status: DC | PRN
  Administered 2016-04-02: 60 mg via INTRAVENOUS

## 2016-04-02 MED ORDER — SODIUM CHLORIDE 0.9 % IV SOLN
INTRAVENOUS | Status: DC
  Administered 2016-04-02: 09:00:00 via INTRAVENOUS
  Administered 2016-04-02: 100 mL/h via INTRAVENOUS
  Administered 2016-04-03: 07:00:00 via INTRAVENOUS

## 2016-04-02 MED ORDER — DEXAMETHASONE SODIUM PHOSPHATE 10 MG/ML IJ SOLN
INTRAMUSCULAR | Status: AC
  Filled 2016-04-02: qty 1

## 2016-04-02 MED ORDER — HYDROMORPHONE HCL 1 MG/ML IJ SOLN
0.2500 mg | INTRAMUSCULAR | Status: DC | PRN
  Administered 2016-04-02: 0.5 mg via INTRAVENOUS

## 2016-04-02 MED ORDER — INFLUENZA VAC SPLIT QUAD 0.5 ML IM SUSY
0.5000 mL | PREFILLED_SYRINGE | INTRAMUSCULAR | Status: AC
  Administered 2016-04-03: 0.5 mL via INTRAMUSCULAR
  Filled 2016-04-02: qty 0.5

## 2016-04-02 MED ORDER — FENTANYL CITRATE (PF) 250 MCG/5ML IJ SOLN
INTRAMUSCULAR | Status: AC
  Filled 2016-04-02: qty 5

## 2016-04-02 MED ORDER — HYDROMORPHONE HCL 1 MG/ML IJ SOLN
INTRAMUSCULAR | Status: AC
  Administered 2016-04-02: 0.5 mg via INTRAVENOUS
  Filled 2016-04-02: qty 0.5

## 2016-04-02 SURGICAL SUPPLY — 46 items
APPLIER CLIP 5 13 M/L LIGAMAX5 (MISCELLANEOUS)
BAG RETRIEVAL 10 (BASKET) ×1
BLADE CLIPPER SURG (BLADE) IMPLANT
CANISTER SUCT 3000ML PPV (MISCELLANEOUS) ×2 IMPLANT
CHLORAPREP W/TINT 26ML (MISCELLANEOUS) ×2 IMPLANT
CLIP APPLIE 5 13 M/L LIGAMAX5 (MISCELLANEOUS) IMPLANT
COVER SURGICAL LIGHT HANDLE (MISCELLANEOUS) ×2 IMPLANT
CUTTER ENDO LINEAR 45M (STAPLE) ×2 IMPLANT
CUTTER FLEX LINEAR 45M (STAPLE) ×2 IMPLANT
DERMABOND ADVANCED (GAUZE/BANDAGES/DRESSINGS) ×1
DERMABOND ADVANCED .7 DNX12 (GAUZE/BANDAGES/DRESSINGS) ×1 IMPLANT
DEVICE PMI PUNCTURE CLOSURE (MISCELLANEOUS) ×2 IMPLANT
ELECT REM PT RETURN 9FT ADLT (ELECTROSURGICAL) ×2
ELECTRODE REM PT RTRN 9FT ADLT (ELECTROSURGICAL) ×1 IMPLANT
GLOVE BIO SURGEON STRL SZ 6 (GLOVE) ×2 IMPLANT
GLOVE BIO SURGEON STRL SZ 6.5 (GLOVE) ×2 IMPLANT
GLOVE BIO SURGEON STRL SZ8 (GLOVE) ×2 IMPLANT
GLOVE BIOGEL PI IND STRL 6.5 (GLOVE) ×1 IMPLANT
GLOVE BIOGEL PI IND STRL 7.0 (GLOVE) ×1 IMPLANT
GLOVE BIOGEL PI INDICATOR 6.5 (GLOVE) ×1
GLOVE BIOGEL PI INDICATOR 7.0 (GLOVE) ×1
GOWN STRL REUS W/ TWL LRG LVL3 (GOWN DISPOSABLE) ×2 IMPLANT
GOWN STRL REUS W/TWL LRG LVL3 (GOWN DISPOSABLE) ×2
KIT BASIN OR (CUSTOM PROCEDURE TRAY) ×2 IMPLANT
KIT ROOM TURNOVER OR (KITS) ×2 IMPLANT
NEEDLE INSUFFLATION 14GA 120MM (NEEDLE) ×2 IMPLANT
NS IRRIG 1000ML POUR BTL (IV SOLUTION) ×2 IMPLANT
PAD ARMBOARD 7.5X6 YLW CONV (MISCELLANEOUS) ×4 IMPLANT
RELOAD 45 VASCULAR/THIN (ENDOMECHANICALS) ×2 IMPLANT
RELOAD STAPLE TA45 3.5 REG BLU (ENDOMECHANICALS) IMPLANT
SCALPEL HARMONIC ACE (MISCELLANEOUS) ×2 IMPLANT
SCISSORS LAP 5X35 DISP (ENDOMECHANICALS) ×2 IMPLANT
SET IRRIG TUBING LAPAROSCOPIC (IRRIGATION / IRRIGATOR) ×2 IMPLANT
SLEEVE ENDOPATH XCEL 5M (ENDOMECHANICALS) ×2 IMPLANT
SPECIMEN JAR SMALL (MISCELLANEOUS) ×2 IMPLANT
SUT MNCRL AB 3-0 PS2 18 (SUTURE) ×2 IMPLANT
SUT SILK 2 0 SH (SUTURE) IMPLANT
SYS BAG RETRIEVAL 10MM (BASKET) ×1
SYSTEM BAG RETRIEVAL 10MM (BASKET) ×1 IMPLANT
TOWEL OR 17X24 6PK STRL BLUE (TOWEL DISPOSABLE) ×2 IMPLANT
TRAY FOLEY CATH 16FR SILVER (SET/KITS/TRAYS/PACK) ×2 IMPLANT
TRAY LAPAROSCOPIC MC (CUSTOM PROCEDURE TRAY) ×2 IMPLANT
TROCAR BLADELESS 12MM (ENDOMECHANICALS) ×2 IMPLANT
TROCAR XCEL BLUNT TIP 100MML (ENDOMECHANICALS) ×2 IMPLANT
TROCAR XCEL NON-BLD 5MMX100MML (ENDOMECHANICALS) ×2 IMPLANT
TUBING INSUFFLATION (TUBING) ×2 IMPLANT

## 2016-04-02 NOTE — ED Notes (Signed)
Patient transported to Ultrasound 

## 2016-04-02 NOTE — H&P (Signed)
Surgical H&P  CC: abdominal pain  HPI: This is a very nice otherwise healthy 46 yo woman with a 24h history of abdominal pain. The pain started actually in the right lower quadrant and then seemed to become more diffuse, but remains worst in the RLQ. Associated nausea, emesis, chills and sweats but no known fever. No diarrhea or constipation.  Allergies  Allergen Reactions  . Azithromycin     REACTION: caused Nausea, sweating; patient denied this occurred and gave her a dose on 04/28/10 and she did fine.  May have been a one time adverse event vs an allergy.  Saxon NP  . Norgestimate-Eth Estradiol     REACTION: caused mouth swelling    Past Medical History:  Diagnosis Date  . Asthma     Past Surgical History:  Procedure Laterality Date  . knee sx      Family History  Problem Relation Age of Onset  . Heart disease Father   . Hyperlipidemia Father   . Hypertension Father   . Diabetes Father     Social History   Social History  . Marital status: Married    Spouse name: N/A  . Number of children: N/A  . Years of education: N/A   Social History Main Topics  . Smoking status: Never Smoker  . Smokeless tobacco: Never Used  . Alcohol use No  . Drug use: No  . Sexual activity: Yes    Birth control/ protection: IUD   Other Topics Concern  . None   Social History Narrative   Works in housekeeping at [employer]   Husband- comes home 1- 2 x per month- Education administratorpainter.    3 children and 1 grandchild lives at home with her.     No current facility-administered medications on file prior to encounter.    Current Outpatient Prescriptions on File Prior to Encounter  Medication Sig Dispense Refill  . albuterol (PROVENTIL HFA;VENTOLIN HFA) 108 (90 BASE) MCG/ACT inhaler Inhale 2 puffs into the lungs every 6 (six) hours as needed for wheezing or shortness of breath. 1 Inhaler 4  . ibuprofen (ADVIL,MOTRIN) 800 MG tablet Take 1 tablet (800 mg total) by mouth every 8 (eight)  hours as needed. (Patient taking differently: Take 800 mg by mouth every 8 (eight) hours as needed for moderate pain. ) 21 tablet 0  . loratadine (CLARITIN) 10 MG tablet Take 10 mg by mouth daily as needed for allergies. Please print label in spanish     . Omeprazole 20 MG TBEC Take 1 tablet (20 mg total) by mouth daily. 90 tablet 3    Review of Systems: a complete, 10pt review of systems was completed with pertinent positives and negatives as documented in the HPI.   Physical Exam: Vitals:   04/02/16 0700 04/02/16 0730  BP: 100/67 101/68  Pulse: 67 62  Resp:    Temp:     Gen: A&Ox3, no distress  Head: normocephalic, atraumatic, EOMI, anicteric.  Neck: supple without mass or thyromegaly Chest: unlabored respirations, symmetrical air entry  Cardiovascular: RRR with palpable distal pulses Abdomen: soft, nondistended, tender with voluntary guarding in right lower quadrant, positive Rovsing sign. No palpable mass or organomegaly, no hernia, no surgical scars Extremities: warm, without edema, no deformities  Neuro: grossly intact Psych: appropriate mood and affect normal insight Skin: no lesions or rashes on limited skin exam   CBC Latest Ref Rng & Units 04/01/2016 09/10/2009 09/01/2007  WBC 4.0 - 10.5 K/uL 11.2(H) 4.4 5.8  Hemoglobin  12.0 - 15.0 g/dL 40.9 81.1 91.4  Hematocrit 36.0 - 46.0 % 41.6 40.0 39.2  Platelets 150 - 400 K/uL 192 183 206    CMP Latest Ref Rng & Units 04/01/2016 06/26/2011 09/10/2009  Glucose 65 - 99 mg/dL 782(N) 78 562(Z)  BUN 6 - 20 mg/dL 12 9 13   Creatinine 0.44 - 1.00 mg/dL 3.08 6.57 8.46  Sodium 135 - 145 mmol/L 137 140 136  Potassium 3.5 - 5.1 mmol/L 3.7 4.2 4.0  Chloride 101 - 111 mmol/L 102 107 104  CO2 22 - 32 mmol/L 24 26 26   Calcium 8.9 - 10.3 mg/dL 9.5 9.1 8.9  Total Protein 6.5 - 8.1 g/dL 7.5 6.5 7.3  Total Bilirubin 0.3 - 1.2 mg/dL 0.5 0.3 0.4  Alkaline Phos 38 - 126 U/L 48 40 45  AST 15 - 41 U/L 20 15 15   ALT 14 - 54 U/L 18 13 10     No results  found for: INR, PROTIME  Imaging: CLINICAL DATA:  Right lower quadrant pain and vomiting.  EXAM: CT ABDOMEN AND PELVIS WITH CONTRAST  TECHNIQUE: Multidetector CT imaging of the abdomen and pelvis was performed using the standard protocol following bolus administration of intravenous contrast.  CONTRAST:  ISOVUE-300 IOPAMIDOL (ISOVUE-300) INJECTION 61%  COMPARISON:  None.  FINDINGS: Lower chest: The lung bases are clear.  Hepatobiliary: No focal liver abnormality is seen. No gallstones, gallbladder wall thickening, or biliary dilatation. Trace perihepatic fluid.  Pancreas: No ductal dilatation or inflammation.  Spleen: Splenic cleft posteriorly. Spleen is normal in size. Small volume perisplenic free fluid, simple fluid density.  Adrenals/Urinary Tract: Normal adrenal glands. No hydronephrosis or perinephric edema. Left kidney smaller than right. Symmetric homogeneous enhancement. Urinary bladder is minimally distended.  Stomach/Bowel: Stomach is decompressed. Fluid-filled prominent small bowel loops in the central abdomen. No evidence of obstruction. The appendix is not identified, normal or abnormal. Small to moderate stool burden without colonic wall thickening  Vascular/Lymphatic: Circumaortic left renal vein. Abdominal aorta is normal in caliber. No adenopathy.  Reproductive: Intrauterine device in the uterus. Ovaries not confidently identified. Moderate free fluid in the pelvis and right adnexa appears minimally complex. The ovaries are not well-defined.  Other: Pelvic, right and left upper quadrant free fluid. No free air. No loculated abdominal abscess.  Musculoskeletal: There are no acute or suspicious osseous abnormalities.  IMPRESSION: Free fluid in the pelvis and right adnexa, minimally complex. Appendix not confidently identified, normal or abnormal. Considerations include adnexal process such as ruptured ovarian cyst or pelvic  inflammatory disease versus occult appendicitis obscured by adjacent free fluid.  Pelvic ultrasound could be considered for evaluation of the adnexal structures. A repeat exam could be considered after administration of enteric contrast to better define enteric structures.  Small amount of perihepatic and perisplenic fluid is likely reactive secondary to the pelvic process.   Electronically Signed   By: Rubye Oaks M.D.   On: 04/02/2016 04:39 CLINICAL DATA:  45 y/o F; right lower quadrant pelvic pain for 1 day.  EXAM: TRANSABDOMINAL AND TRANSVAGINAL ULTRASOUND OF PELVIS  DOPPLER ULTRASOUND OF OVARIES  TECHNIQUE: Both transabdominal and transvaginal ultrasound examinations of the pelvis were performed. Transabdominal technique was performed for global imaging of the pelvis including uterus, ovaries, adnexal regions, and pelvic cul-de-sac.  It was necessary to proceed with endovaginal exam following the transabdominal exam to visualize the bilateral ovaries. Color and duplex Doppler ultrasound was utilized to evaluate blood flow to the ovaries.  COMPARISON:  04/02/2016 CT of the  abdomen and pelvis.  FINDINGS: Uterus  Measurements: 6.8 x 3.1 x 4.6 cm. No fibroids are mass identified. Nabothian cysts. IUD in situ appears well seated.  Endometrium  Thickness: 6.7 mm.  No focal abnormality visualized.  Right ovary  Measurements: 2.7 x 1.4 x 2.4 cm. There are 2 simple appearing cysts 1 of which measures up to 1.0 cm and the other measuring up to 1.1 cm.  Left ovary  Measurements: 2.9 x 2.3 x 2.6 cm. There is a cyst measuring up to 2.4 cm with internal echoes.  Pulsed Doppler evaluation of both ovaries demonstrates normal low-resistance arterial and venous waveforms.  Other findings  Moderate volume of free fluid in the pelvis as seen on prior CT.  IMPRESSION: 1. Left ovarian cyst with internal echoes measuring up to 2.4  cm suggestive, but not classic for hemorrhagic cyst. 6-12 week follow-up ultrasound is recommended to ensure resolution. This recommendation follows the consensus statement: Management of Asymptomatic Ovarian and Other Adnexal Cysts Imaged at [place]: Society of Radiologists in Ultrasound Consensus Conference Statement. Radiology 2010; [id number]. 2. No evidence for ovarian torsion at this time. 3. Moderate volume of free fluid in the pelvis as seen on prior CT. 4. Otherwise unremarkable pelvic ultrasound.   Electronically Signed   By: Mitzi Hansen M.D.   On: 04/02/2016 06:42  A/P: 45yo woman with acute onset RLQ pain. Her CT did not visualize the appendix but there is some fluid in the pelvis, and her history and exam are consistent with appendicitis. Merits at least diagnostic laparoscopy. Will plan for lap appy this afternoon.    Phylliss Blakes, MD [facility], [place] Pager [phone]

## 2016-04-02 NOTE — Op Note (Signed)
04/02/2016  6:41 PM  PATIENT:  Tracey Kane  46 y.o. female  PRE-OPERATIVE DIAGNOSIS:  APPENDICITIS  POST-OPERATIVE DIAGNOSIS:  APPENDICITIS  PROCEDURE:  Procedure(s): APPENDECTOMY LAPAROSCOPIC  SURGEON:  Surgeon(s): Violeta GelinasBurke Lovell Roe, MD  ASSISTANTS: none   ANESTHESIA:   local and general  EBL:  No intake/output data recorded.  BLOOD ADMINISTERED:none  DRAINS: none   SPECIMEN:  Excision  DISPOSITION OF SPECIMEN:  PATHOLOGY  COUNTS:  YES  DICTATION: .Dragon Dictation Findings: Inflammation of distal appendix without perforation, cloudy fluid in pelvis  Procedure in detail: [name] 's brought for emergency appendectomy. She was identified in the preop holding area. Informed consent was obtained. She received intravenous antibiotics. She was brought the operating room and general endotracheal anesthesia was administered by the anesthesia staff. Her abdomen was prepped and draped in sterile fashion. Time out procedure was performed.The infraumbilical region was infiltrated with local. Infraumbilical incision was made. Subcutaneous tissues were dissected down revealing the anterior fascia. This was divided sharply along the midline. Peritoneal cavity was entered under direct vision without complication. A 0 Vicryl pursestring was placed around the fascial opening. Hassan trocar was inserted into the abdomen. The abdomen was insufflated with carbon dioxide in standard fashion. Under direct vision a 12 mm left lower quadrant and 5 mm right abdomen port were placed. Local was used at each port site. Laparoscopic exploration revealed some cloudy fluid down in her pelvis. The appendix was identified. The distal portion of it was inflamed but there is no perforation. The mesoappendix was divided with the harmonic scalpel achieving excellent hemostasis. The base of the appendix was divided with Endo GIA with vascular load. The appendix was placed in a bag and removed from the abdomen. It was  sent to pathology. The abdomen was copiously irrigated. Irrigation fluid was evacuated. The staple line was intact. There was no bleeding. Pneumoperitoneum was released. Ports were removed under direct vision. Informed local fascia was closed by tying the pursestring. All 3 wounds were irrigated and the skin of each was closed with running 4 Vicryl subcuticular followed by Dermabond. All counts were correct. She tolerated the procedure well without apparent complication and was taken recovery in stable condition.  PATIENT DISPOSITION:  PACU - hemodynamically stable.   Delay start of Pharmacological VTE agent (>24hrs) due to surgical blood loss or risk of bleeding:  no  Violeta GelinasBurke [name], MD, MPH, FACS Pager: [phone]  3/29/20186:41 PM

## 2016-04-02 NOTE — Anesthesia Postprocedure Evaluation (Signed)
Anesthesia Post Note  Patient: Tracey Kane  Procedure(s) Performed: Procedure(s) (LRB): APPENDECTOMY LAPAROSCOPIC (N/A)  Patient location during evaluation: PACU Anesthesia Type: General Level of consciousness: awake and alert Pain management: pain level controlled Vital Signs Assessment: post-procedure vital signs reviewed and stable Respiratory status: spontaneous breathing, nonlabored ventilation, respiratory function stable and patient connected to nasal cannula oxygen Cardiovascular status: blood pressure returned to baseline and stable Postop Assessment: no signs of nausea or vomiting Anesthetic complications: no       Last Vitals:  Vitals:   04/02/16 1900 04/02/16 1915  BP: 126/63 116/72  Pulse: 71 76  Resp: 14 14  Temp:      Last Pain:  Vitals:   04/02/16 1915  TempSrc:   PainSc: Asleep                 Tracey Kane DAVID

## 2016-04-02 NOTE — Anesthesia Preprocedure Evaluation (Signed)
Anesthesia Evaluation  Patient identified by MRN, date of birth, ID band Patient awake    Reviewed: Allergy & Precautions, H&P , NPO status , Patient's Chart, lab work & pertinent test results  Airway Mallampati: II  TM Distance: >3 FB Neck ROM: Full    Dental no notable dental hx. (+) Teeth Intact, Dental Advisory Given   Pulmonary asthma ,    Pulmonary exam normal breath sounds clear to auscultation       Cardiovascular negative cardio ROS   Rhythm:Regular Rate:Normal     Neuro/Psych  Headaches, negative psych ROS   GI/Hepatic negative GI ROS, Neg liver ROS,   Endo/Other  negative endocrine ROS  Renal/GU negative Renal ROS  negative genitourinary   Musculoskeletal   Abdominal   Peds  Hematology negative hematology ROS (+)   Anesthesia Other Findings   Reproductive/Obstetrics negative OB ROS                             Anesthesia Physical Anesthesia Plan  ASA: II  Anesthesia Plan: General   Post-op Pain Management:    Induction: Intravenous  Airway Management Planned: Oral ETT  Additional Equipment:   Intra-op Plan:   Post-operative Plan: Extubation in OR  Informed Consent: I have reviewed the patients History and Physical, chart, labs and discussed the procedure including the risks, benefits and alternatives for the proposed anesthesia with the patient or authorized representative who has indicated his/her understanding and acceptance.   Dental advisory given  Plan Discussed with: CRNA  Anesthesia Plan Comments:         Anesthesia Quick Evaluation

## 2016-04-02 NOTE — ED Notes (Signed)
Dr. Bebe ShaggyWickline explained tests results and plan of care to pt.

## 2016-04-02 NOTE — ED Notes (Signed)
Called OR stated to transport patient in 10 minutes.

## 2016-04-02 NOTE — ED Notes (Signed)
Patient transported to CT scan . 

## 2016-04-02 NOTE — Progress Notes (Signed)
Patient ID: Tracey Kane, female   DOB: 1970-04-23, 46 y.o.   MRN: [id number] Patient examined and I agree with Dr. Derrill Kane's assessment and plan. For laparoscopic appendectomy. I again discussed the procedure, risks, and benefits. She agrees.  Tracey Kane Tracey Snellgrove, MD, MPH, FACS Trauma: [phone] General Surgery: [phone]

## 2016-04-02 NOTE — Transfer of Care (Signed)
Immediate Anesthesia Transfer of Care Note  Patient: Tracey Kane  Procedure(s) Performed: Procedure(s): APPENDECTOMY LAPAROSCOPIC (N/A)  Patient Location: PACU  Anesthesia Type:General  Level of Consciousness: awake, alert  and oriented  Airway & Oxygen Therapy: Patient Spontanous Breathing and Patient connected to nasal cannula oxygen  Post-op Assessment: Report given to RN, Post -op Vital signs reviewed and stable and Patient moving all extremities  Post vital signs: Reviewed and stable  Last Vitals:  Vitals:   04/02/16 1030 04/02/16 1355  BP: 103/62 107/63  Pulse:  63  Resp:  16  Temp:      Last Pain:  Vitals:   04/02/16 0955  TempSrc:   PainSc: 6          Complications: No apparent anesthesia complications

## 2016-04-02 NOTE — ED Provider Notes (Signed)
[facility]-EMERGENCY DEPT Provider Note   CSN: [id number] Arrival date & time: 04/01/16  2111  By signing my name below, I, Tracey Kane, attest that this documentation has been prepared under the direction and in the presence of Zadie Rhineonald Cheryel Kyte, MD. Electronically Signed: Elder Negusussell Kane, Scribe. 04/02/16. 2:36 AM.   History   Chief Complaint Chief Complaint  Patient presents with  . Abdominal Pain    HPI Tracey Kane is a 46 y.o. female with history of asthma who presents to the ED for evaluation of abdominal pain. This patient states that 12-18 hours ago she woke with R sided abdominal cramping greatest over the RUQ with nausea and nonbloody vomiting. At interview, she is continuing to report abdominal pain which is constant. No cough. No dysuria. No vaginal bleeding or discharge. No prior abdominal surgeries.  Her pain is worsening, and it is worse with palpation  The history is provided by the patient. No language interpreter was used.    Past Medical History:  Diagnosis Date  . Asthma     Patient Active Problem List   Diagnosis Date Noted  . Ulcer of toe of left foot (HCC) 06/10/2010  . 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 ASTHMA, INTERMITTENT, MODERATE 11/22/2006  . GASTROESOPHAGEAL REFLUX DISEASE 10/20/2006    Past Surgical History:  Procedure Laterality Date  . knee sx      OB History    No data available       Home Medications    Prior to Admission medications   Medication Sig Start Date End Date Taking? Authorizing Provider  albuterol (PROVENTIL HFA;VENTOLIN HFA) 108 (90 BASE) MCG/ACT inhaler Inhale 2 puffs into the lungs every 6 (six) hours as needed for wheezing or shortness of breath. 04/10/13  Yes Charm RingsErin J Honig, MD  ibuprofen (ADVIL,MOTRIN) 800 MG tablet Take 1 tablet (800 mg total) by mouth every 8 (eight) hours as needed. Patient taking differently: Take 800 mg by mouth every 8 (eight) hours as needed for moderate pain.   12/15/15  Yes Christopher Lawyer, PA-C  loratadine (CLARITIN) 10 MG tablet Take 10 mg by mouth daily as needed for allergies. Please print label in spanish    Yes Historical Provider, MD  Omeprazole 20 MG TBEC Take 1 tablet (20 mg total) by mouth daily. 04/12/12  Yes Barbaraann BarthelJames O Breen, MD    Family History Family History  Problem Relation Age of Onset  . Heart disease Father   . Hyperlipidemia Father   . Hypertension Father   . Diabetes Father     Social History Social History  Substance Use Topics  . Smoking status: Never Smoker  . Smokeless tobacco: Never Used  . Alcohol use No     Allergies   Azithromycin and Norgestimate-eth estradiol   Review of Systems Review of Systems  Constitutional: Negative for fever.  Respiratory: Negative for cough.   Gastrointestinal: Positive for abdominal pain, nausea and vomiting. Negative for diarrhea.  Genitourinary: Negative for dysuria, vaginal bleeding and vaginal discharge.  All other systems reviewed and are negative.    Physical Exam Updated Vital Signs BP 115/77   Pulse 69   Temp 97.7 F (36.5 C) (Oral)   Resp 18   Ht 5\' 3"  (1.6 m)   Wt 152 lb (68.9 kg)   SpO2 98%   BMI 26.93 kg/m   Physical Exam CONSTITUTIONAL: Well developed/well nourished HEAD: Normocephalic/atraumatic EYES: EOMI/PERRL, no icterus ENMT: Mucous membranes moist NECK: supple no meningeal signs SPINE/BACK:entire spine  nontender CV: S1/S2 noted, no murmurs/rubs/gallops noted LUNGS: Lungs are clear to auscultation bilaterally, no apparent distress ABDOMEN: soft, moderate RUQ tenderness, minimal RLQ tenderness, no rebound or guarding, bowel sounds noted throughout abdomen GU:no cva tenderness NEURO: Pt is awake/alert/appropriate, moves all extremitiesx4.  No facial droop.   EXTREMITIES: pulses normal/equal, full ROM SKIN: warm, color normal PSYCH: no abnormalities of mood noted, alert and oriented to situation   ED Treatments / Results  Labs (all  labs ordered are listed, but only abnormal results are displayed) Labs Reviewed  COMPREHENSIVE METABOLIC PANEL - Abnormal; Notable for the following:       Result Value   Glucose, Bld 111 (*)    All other components within normal limits  CBC - Abnormal; Notable for the following:    WBC 11.2 (*)    All other components within normal limits  URINALYSIS, ROUTINE W REFLEX MICROSCOPIC - Abnormal; Notable for the following:    Hgb urine dipstick SMALL (*)    Ketones, ur 5 (*)    Bacteria, UA RARE (*)    Squamous Epithelial / LPF 0-5 (*)    All other components within normal limits  LIPASE, BLOOD  I-STAT BETA HCG BLOOD, ED ([facility], [facility], [facility] ONLY)    EKG  EKG Interpretation None       Radiology US Transvaginal Non-ob  Result Date: 04/02/2016 CLINICAL DATA:  46 y/o F; right lower quadrant pelvic pain for 1 day. EXAM: TRANSABDOMINAL AND TRANSVAGINAL ULTRASOUND OF PELVIS DOPPLER ULTRASOUND OF OVARIES TECHNIQUE: Both transabdominal and transvaginal ultrasound examinations of the pelvis were performed. Transabdominal technique was performed for global imaging of the pelvis including uterus, ovaries, adnexal regions, and pelvic cul-de-sac. It was necessary to proceed with endovaginal exam following the transabdominal exam to visualize the bilateral ovaries. Color and duplex Doppler ultrasound was utilized to evaluate blood flow to the ovaries. COMPARISON:  04/02/2016 CT of the abdomen and pelvis. FINDINGS: Uterus Measurements: 6.8 x 3.1 x 4.6 cm. No fibroids are mass identified. Nabothian cysts. IUD in situ appears well seated. Endometrium Thickness: 6.7 mm.  No focal abnormality visualized. Right ovary Measurements: 2.7 x 1.4 x 2.4 cm. There are 2 simple appearing cysts 1 of which measures up to 1.0 cm and the other measuring up to 1.1 cm. Left ovary Measurements: 2.9 x 2.3 x 2.6 cm. There is a cyst measuring up to 2.4 cm with internal echoes. Pulsed Doppler evaluation of both ovaries demonstrates normal  low-resistance arterial and venous waveforms. Other findings Moderate volume of free fluid in the pelvis as seen on prior CT. IMPRESSION: 1. Left ovarian cyst with internal echoes measuring up to 2.4 cm suggestive, but not classic for hemorrhagic cyst. 6-12 week follow-up ultrasound is recommended to ensure resolution. This recommendation follows the consensus statement: Management of Asymptomatic Ovarian and Other Adnexal Cysts Imaged at [place]: Society of Radiologists in Ultrasound Consensus Conference Statement. Radiology 2010; [phone]. 2. No evidence for ovarian torsion at this time. 3. Moderate volume of free fluid in the pelvis as seen on prior CT. 4. Otherwise unremarkable pelvic ultrasound. Electronically Signed   By: Mitzi Hansen M.D.   On: 04/02/2016 06:42   US Pelvis Complete  Result Date: 04/02/2016 CLINICAL DATA:  46 y/o F; right lower quadrant pelvic pain for 1 day. EXAM: TRANSABDOMINAL AND TRANSVAGINAL ULTRASOUND OF PELVIS DOPPLER ULTRASOUND OF OVARIES TECHNIQUE: Both transabdominal and transvaginal ultrasound examinations of the pelvis were performed. Transabdominal technique was performed for global imaging of the pelvis including uterus, ovaries,  adnexal regions, and pelvic cul-de-sac. It was necessary to proceed with endovaginal exam following the transabdominal exam to visualize the bilateral ovaries. Color and duplex Doppler ultrasound was utilized to evaluate blood flow to the ovaries. COMPARISON:  04/02/2016 CT of the abdomen and pelvis. FINDINGS: Uterus Measurements: 6.8 x 3.1 x 4.6 cm. No fibroids are mass identified. Nabothian cysts. IUD in situ appears well seated. Endometrium Thickness: 6.7 mm.  No focal abnormality visualized. Right ovary Measurements: 2.7 x 1.4 x 2.4 cm. There are 2 simple appearing cysts 1 of which measures up to 1.0 cm and the other measuring up to 1.1 cm. Left ovary Measurements: 2.9 x 2.3 x 2.6 cm. There is a cyst measuring up to 2.4 cm with internal  echoes. Pulsed Doppler evaluation of both ovaries demonstrates normal low-resistance arterial and venous waveforms. Other findings Moderate volume of free fluid in the pelvis as seen on prior CT. IMPRESSION: 1. Left ovarian cyst with internal echoes measuring up to 2.4 cm suggestive, but not classic for hemorrhagic cyst. 6-12 week follow-up ultrasound is recommended to ensure resolution. This recommendation follows the consensus statement: Management of Asymptomatic Ovarian and Other Adnexal Cysts Imaged at [place]: Society of Radiologists in Ultrasound Consensus Conference Statement. Radiology 2010; [phone]. 2. No evidence for ovarian torsion at this time. 3. Moderate volume of free fluid in the pelvis as seen on prior CT. 4. Otherwise unremarkable pelvic ultrasound. Electronically Signed   By: Mitzi Hansen M.D.   On: 04/02/2016 06:42   Ct Abdomen Pelvis W Contrast  Result Date: 04/02/2016 CLINICAL DATA:  Right lower quadrant pain and vomiting. EXAM: CT ABDOMEN AND PELVIS WITH CONTRAST TECHNIQUE: Multidetector CT imaging of the abdomen and pelvis was performed using the standard protocol following bolus administration of intravenous contrast. CONTRAST:  ISOVUE-300 IOPAMIDOL (ISOVUE-300) INJECTION 61% COMPARISON:  None. FINDINGS: Lower chest: The lung bases are clear. Hepatobiliary: No focal liver abnormality is seen. No gallstones, gallbladder wall thickening, or biliary dilatation. Trace perihepatic fluid. Pancreas: No ductal dilatation or inflammation. Spleen: Splenic cleft posteriorly. Spleen is normal in size. Small volume perisplenic free fluid, simple fluid density. Adrenals/Urinary Tract: Normal adrenal glands. No hydronephrosis or perinephric edema. Left kidney smaller than right. Symmetric homogeneous enhancement. Urinary bladder is minimally distended. Stomach/Bowel: Stomach is decompressed. Fluid-filled prominent small bowel loops in the central abdomen. No evidence of obstruction.  The appendix is not identified, normal or abnormal. Small to moderate stool burden without colonic wall thickening Vascular/Lymphatic: Circumaortic left renal vein. Abdominal aorta is normal in caliber. No adenopathy. Reproductive: Intrauterine device in the uterus. Ovaries not confidently identified. Moderate free fluid in the pelvis and right adnexa appears minimally complex. The ovaries are not well-defined. Other: Pelvic, right and left upper quadrant free fluid. No free air. No loculated abdominal abscess. Musculoskeletal: There are no acute or suspicious osseous abnormalities. IMPRESSION: Free fluid in the pelvis and right adnexa, minimally complex. Appendix not confidently identified, normal or abnormal. Considerations include adnexal process such as ruptured ovarian cyst or pelvic inflammatory disease versus occult appendicitis obscured by adjacent free fluid. Pelvic ultrasound could be considered for evaluation of the adnexal structures. A repeat exam could be considered after administration of enteric contrast to better define enteric structures. Small amount of perihepatic and perisplenic fluid is likely reactive secondary to the pelvic process. Electronically Signed   By: Rubye Oaks M.D.   On: 04/02/2016 04:39   [place] Art/ven Flow Abd Pelv Doppler Limited  Result Date: 04/02/2016 CLINICAL DATA:  46 y/o  F; right lower quadrant pelvic pain for 1 day. EXAM: TRANSABDOMINAL AND TRANSVAGINAL ULTRASOUND OF PELVIS DOPPLER ULTRASOUND OF OVARIES TECHNIQUE: Both transabdominal and transvaginal ultrasound examinations of the pelvis were performed. Transabdominal technique was performed for global imaging of the pelvis including uterus, ovaries, adnexal regions, and pelvic cul-de-sac. It was necessary to proceed with endovaginal exam following the transabdominal exam to visualize the bilateral ovaries. Color and duplex Doppler ultrasound was utilized to evaluate blood flow to the ovaries. COMPARISON:   04/02/2016 CT of the abdomen and pelvis. FINDINGS: Uterus Measurements: 6.8 x 3.1 x 4.6 cm. No fibroids are mass identified. Nabothian cysts. IUD in situ appears well seated. Endometrium Thickness: 6.7 mm.  No focal abnormality visualized. Right ovary Measurements: 2.7 x 1.4 x 2.4 cm. There are 2 simple appearing cysts 1 of which measures up to 1.0 cm and the other measuring up to 1.1 cm. Left ovary Measurements: 2.9 x 2.3 x 2.6 cm. There is a cyst measuring up to 2.4 cm with internal echoes. Pulsed Doppler evaluation of both ovaries demonstrates normal low-resistance arterial and venous waveforms. Other findings Moderate volume of free fluid in the pelvis as seen on prior CT. IMPRESSION: 1. Left ovarian cyst with internal echoes measuring up to 2.4 cm suggestive, but not classic for hemorrhagic cyst. 6-12 week follow-up ultrasound is recommended to ensure resolution. This recommendation follows the consensus statement: Management of Asymptomatic Ovarian and Other Adnexal Cysts Imaged at [place]: Society of Radiologists in Ultrasound Consensus Conference Statement. Radiology 2010; [phone]. 2. No evidence for ovarian torsion at this time. 3. Moderate volume of free fluid in the pelvis as seen on prior CT. 4. Otherwise unremarkable pelvic ultrasound. Electronically Signed   By: Mitzi Hansen M.D.   On: 04/02/2016 06:42    Procedures Procedures (including critical care time)  Medications Ordered in ED Medications  ondansetron (ZOFRAN-ODT) disintegrating tablet 8 mg (8 mg Oral Given 04/02/16 0241)  HYDROcodone-acetaminophen (NORCO/VICODIN) 5-325 MG per tablet 1 tablet (1 tablet Oral Given 04/02/16 0241)  fentaNYL (SUBLIMAZE) injection 50 mcg (50 mcg Intravenous Given 04/02/16 0335)  ondansetron (ZOFRAN) injection 4 mg (4 mg Intravenous Given 04/02/16 0335)  iopamidol (ISOVUE-300) 61 % injection (100 mLs  Contrast Given 04/02/16 0351)     Initial Impression / Assessment and Plan / ED Course  I have  reviewed the triage vital signs and the nursing notes.  Pertinent labs  results that were available during my care of the patient were reviewed by me and considered in my medical decision making (see chart for details).     5:01 AM Pt with abnormal CT findings, free fluid, probable adnexal/ovarian in origin but unable to fully r/o appendicitis She is well appearing and only minimal tenderness,no guarding Will proceed with US pelvis,  Pt agreeable 7:29 AM Pt still with RLQ tenderness on exam, though overall well appearing Appendicitis is still not definitively ruled out Due to concern for occult appendicitis, I have consulted general surgery to evaluate patient. Pt updated on plan She is agreeable at this time  Final Clinical Impressions(s) / ED Diagnoses   Final diagnoses:  Pelvic pain  Right lower quadrant abdominal pain    New Prescriptions New Prescriptions   No medications on file  I personally performed the services described in this documentation, which was scribed in my presence. The recorded information has been reviewed and is accurate.       Zadie Rhine, MD 04/02/16 0730

## 2016-04-03 ENCOUNTER — Encounter (HOSPITAL_COMMUNITY): Payer: Self-pay | Admitting: General Surgery

## 2016-04-03 LAB — CBC
HCT: 35.8 % — ABNORMAL LOW (ref 36.0–46.0)
Hemoglobin: 11.6 g/dL — ABNORMAL LOW (ref 12.0–15.0)
MCH: 29.2 pg (ref 26.0–34.0)
MCHC: 32.4 g/dL (ref 30.0–36.0)
MCV: 90.2 fL (ref 78.0–100.0)
Platelets: 164 10*3/uL (ref 150–400)
RBC: 3.97 MIL/uL (ref 3.87–5.11)
RDW: 13.8 % (ref 11.5–15.5)
WBC: 6.8 10*3/uL (ref 4.0–10.5)

## 2016-04-03 LAB — BASIC METABOLIC PANEL
ANION GAP: 8 (ref 5–15)
BUN: 11 mg/dL (ref 6–20)
CHLORIDE: 105 mmol/L (ref 101–111)
CO2: 23 mmol/L (ref 22–32)
Calcium: 8.1 mg/dL — ABNORMAL LOW (ref 8.9–10.3)
Creatinine, Ser: 0.62 mg/dL (ref 0.44–1.00)
GFR calc non Af Amer: >60 mL/min (ref >60)
GLUCOSE: 124 mg/dL — AB (ref 65–99)
Potassium: 3.8 mmol/L (ref 3.5–5.1)
Sodium: 136 mmol/L (ref 135–145)

## 2016-04-03 LAB — MAGNESIUM: Magnesium: 1.7 mg/dL (ref 1.7–2.4)

## 2016-04-03 MED ORDER — OXYCODONE HCL 5 MG PO TABS: 5.0000 mg | ORAL_TABLET | Freq: Four times a day (QID) | ORAL | 0 refills | Status: DC | PRN

## 2016-04-03 NOTE — Progress Notes (Signed)
Reviewed discharge instructions and RX with pt and family verb understanding. Pt left via wheelchair in NAD by volunteer services.

## 2016-04-03 NOTE — Discharge Summary (Signed)
  Patient ID: Tracey Kane [id number] 46 y.o. June 18, 1970  04/02/2016  Discharge date and time: 04/03/2016   Admitting Physician: Glenna Fellows T  Discharge Physician: Glenna Fellows T  Admission Diagnoses: Right lower quadrant abdominal pain, appendicitis   Discharge Diagnoses: Same  Operations: Procedure(s): APPENDECTOMY LAPAROSCOPIC  Admission Condition: fair  Discharged Condition: good  Indication for Admission: Patient is a 46 year old Hispanic female who presents with 24 hours of worsening right lower quadrant abdominal pain. CT scan showed some free fluid in the pelvis and could not confidently identify the appendix. She had localized tenderness and mildly elevated white blood count.  Hospital Course: Decision was made to proceed with laparoscopic appendectomy. At the time of surgery the distal appendix appeared inflamed. No other cause of pain was seen. She underwent an uneventful laparoscopic appendectomy. The following morning she has expected mild soreness but feels better. White blood count is normal. Abdomen is soft and nontender. Incisions are clean and she is felt ready for discharge.   Disposition: Home  Patient Instructions:  Allergies as of 04/03/2016      Reactions   Azithromycin    REACTION: caused Nausea, sweating; patient denied this occurred and gave her a dose on 04/28/10 and she did fine.  May have been a one time adverse event vs an allergy.  Saxon NP   Norgestimate-eth Estradiol    REACTION: caused mouth swelling      Medication List    TAKE these medications   albuterol 108 (90 Base) MCG/ACT inhaler Commonly known as:  PROVENTIL HFA;VENTOLIN HFA Inhale 2 puffs into the lungs every 6 (six) hours as needed for wheezing or shortness of breath.   ibuprofen 800 MG tablet Commonly known as:  ADVIL,MOTRIN Take 1 tablet (800 mg total) by mouth every 8 (eight) hours as needed. What changed:  reasons to take this   loratadine 10 MG  tablet Commonly known as:  CLARITIN Take 10 mg by mouth daily as needed for allergies. Please print label in spanish   Omeprazole 20 MG Tbec Take 1 tablet (20 mg total) by mouth daily.   oxyCODONE 5 MG immediate release tablet Commonly known as:  Oxy IR/ROXICODONE Take 1 tablet (5 mg total) by mouth every 6 (six) hours as needed for moderate pain or severe pain.       Activity: activity as tolerated Diet: regular diet Wound Care: none needed  Follow-up:  With Dr. Janee Morn in 3 weeks.  Signed: Mariella Saa MD, FACS  04/03/2016, 11:24 AM

## 2016-04-03 NOTE — Progress Notes (Signed)
1 Day Post-Op   Subjective: Mild pain. No nausea. No other complaints.  Objective: Vital signs in last 24 hours: Temp:  [97.2 F (36.2 C)-98.3 F (36.8 C)] 98.3 F (36.8 C) (03/30 0629) Pulse Rate:  [63-85] 64 (03/30 0629) Resp:  [12-20] 16 (03/30 0629) BP: (102-129)/(60-77) 102/60 (03/30 0629) SpO2:  [93 %-100 %] 96 % (03/30 0629) Weight:  [69.4 kg (153 lb)] 69.4 kg (153 lb) (03/29 2200) Last BM Date: 04/01/16  Intake/Output from previous day: 03/29 0701 - 03/30 0700 In: 2770 [P.O.:220; I.V.:2250; IV Piggyback:300] Out: -  Intake/Output this shift: No intake/output data recorded.  General appearance: alert, cooperative and no distress GI: normal findings: soft, non-tender Incision/Wound: Clean and dry  Lab Results:   Recent Labs  04/01/16 2154 04/03/16 0528  WBC 11.2* 6.8  HGB 13.7 11.6*  HCT 41.6 35.8*  PLT 192 164   BMET  Recent Labs  04/01/16 2154 04/03/16 0528  NA 137 136  K 3.7 3.8  CL 102 105  CO2 24 23  GLUCOSE 111* 124*  BUN 12 11  CREATININE 0.66 0.62  CALCIUM 9.5 8.1*     Studies/Results: US Transvaginal Non-ob  Result Date: 04/02/2016 CLINICAL DATA:  46 y/o F; right lower quadrant pelvic pain for 1 day. EXAM: TRANSABDOMINAL AND TRANSVAGINAL ULTRASOUND OF PELVIS DOPPLER ULTRASOUND OF OVARIES TECHNIQUE: Both transabdominal and transvaginal ultrasound examinations of the pelvis were performed. Transabdominal technique was performed for global imaging of the pelvis including uterus, ovaries, adnexal regions, and pelvic cul-de-sac. It was necessary to proceed with endovaginal exam following the transabdominal exam to visualize the bilateral ovaries. Color and duplex Doppler ultrasound was utilized to evaluate blood flow to the ovaries. COMPARISON:  04/02/2016 CT of the abdomen and pelvis. FINDINGS: Uterus Measurements: 6.8 x 3.1 x 4.6 cm. No fibroids are mass identified. Nabothian cysts. IUD in situ appears well seated. Endometrium Thickness: 6.7  mm.  No focal abnormality visualized. Right ovary Measurements: 2.7 x 1.4 x 2.4 cm. There are 2 simple appearing cysts 1 of which measures up to 1.0 cm and the other measuring up to 1.1 cm. Left ovary Measurements: 2.9 x 2.3 x 2.6 cm. There is a cyst measuring up to 2.4 cm with internal echoes. Pulsed Doppler evaluation of both ovaries demonstrates normal low-resistance arterial and venous waveforms. Other findings Moderate volume of free fluid in the pelvis as seen on prior CT. IMPRESSION: 1. Left ovarian cyst with internal echoes measuring up to 2.4 cm suggestive, but not classic for hemorrhagic cyst. 6-12 week follow-up ultrasound is recommended to ensure resolution. This recommendation follows the consensus statement: Management of Asymptomatic Ovarian and Other Adnexal Cysts Imaged at [place]: Society of Radiologists in Ultrasound Consensus Conference Statement. Radiology 2010; [id number]. 2. No evidence for ovarian torsion at this time. 3. Moderate volume of free fluid in the pelvis as seen on prior CT. 4. Otherwise unremarkable pelvic ultrasound. Electronically Signed   By: Mitzi Hansen M.D.   On: 04/02/2016 06:42   US Pelvis Complete  Result Date: 04/02/2016 CLINICAL DATA:  46 y/o F; right lower quadrant pelvic pain for 1 day. EXAM: TRANSABDOMINAL AND TRANSVAGINAL ULTRASOUND OF PELVIS DOPPLER ULTRASOUND OF OVARIES TECHNIQUE: Both transabdominal and transvaginal ultrasound examinations of the pelvis were performed. Transabdominal technique was performed for global imaging of the pelvis including uterus, ovaries, adnexal regions, and pelvic cul-de-sac. It was necessary to proceed with endovaginal exam following the transabdominal exam to visualize the bilateral ovaries. Color and duplex Doppler ultrasound was utilized to  evaluate blood flow to the ovaries. COMPARISON:  04/02/2016 CT of the abdomen and pelvis. FINDINGS: Uterus Measurements: 6.8 x 3.1 x 4.6 cm. No fibroids are mass identified.  Nabothian cysts. IUD in situ appears well seated. Endometrium Thickness: 6.7 mm.  No focal abnormality visualized. Right ovary Measurements: 2.7 x 1.4 x 2.4 cm. There are 2 simple appearing cysts 1 of which measures up to 1.0 cm and the other measuring up to 1.1 cm. Left ovary Measurements: 2.9 x 2.3 x 2.6 cm. There is a cyst measuring up to 2.4 cm with internal echoes. Pulsed Doppler evaluation of both ovaries demonstrates normal low-resistance arterial and venous waveforms. Other findings Moderate volume of free fluid in the pelvis as seen on prior CT. IMPRESSION: 1. Left ovarian cyst with internal echoes measuring up to 2.4 cm suggestive, but not classic for hemorrhagic cyst. 6-12 week follow-up ultrasound is recommended to ensure resolution. This recommendation follows the consensus statement: Management of Asymptomatic Ovarian and Other Adnexal Cysts Imaged at [place]: Society of Radiologists in Ultrasound Consensus Conference Statement. Radiology 2010; [phone]. 2. No evidence for ovarian torsion at this time. 3. Moderate volume of free fluid in the pelvis as seen on prior CT. 4. Otherwise unremarkable pelvic ultrasound. Electronically Signed   By: Mitzi Hansen M.D.   On: 04/02/2016 06:42   Ct Abdomen Pelvis W Contrast  Result Date: 04/02/2016 CLINICAL DATA:  Right lower quadrant pain and vomiting. EXAM: CT ABDOMEN AND PELVIS WITH CONTRAST TECHNIQUE: Multidetector CT imaging of the abdomen and pelvis was performed using the standard protocol following bolus administration of intravenous contrast. CONTRAST:  ISOVUE-300 IOPAMIDOL (ISOVUE-300) INJECTION 61% COMPARISON:  None. FINDINGS: Lower chest: The lung bases are clear. Hepatobiliary: No focal liver abnormality is seen. No gallstones, gallbladder wall thickening, or biliary dilatation. Trace perihepatic fluid. Pancreas: No ductal dilatation or inflammation. Spleen: Splenic cleft posteriorly. Spleen is normal in size. Small volume  perisplenic free fluid, simple fluid density. Adrenals/Urinary Tract: Normal adrenal glands. No hydronephrosis or perinephric edema. Left kidney smaller than right. Symmetric homogeneous enhancement. Urinary bladder is minimally distended. Stomach/Bowel: Stomach is decompressed. Fluid-filled prominent small bowel loops in the central abdomen. No evidence of obstruction. The appendix is not identified, normal or abnormal. Small to moderate stool burden without colonic wall thickening Vascular/Lymphatic: Circumaortic left renal vein. Abdominal aorta is normal in caliber. No adenopathy. Reproductive: Intrauterine device in the uterus. Ovaries not confidently identified. Moderate free fluid in the pelvis and right adnexa appears minimally complex. The ovaries are not well-defined. Other: Pelvic, right and left upper quadrant free fluid. No free air. No loculated abdominal abscess. Musculoskeletal: There are no acute or suspicious osseous abnormalities. IMPRESSION: Free fluid in the pelvis and right adnexa, minimally complex. Appendix not confidently identified, normal or abnormal. Considerations include adnexal process such as ruptured ovarian cyst or pelvic inflammatory disease versus occult appendicitis obscured by adjacent free fluid. Pelvic ultrasound could be considered for evaluation of the adnexal structures. A repeat exam could be considered after administration of enteric contrast to better define enteric structures. Small amount of perihepatic and perisplenic fluid is likely reactive secondary to the pelvic process. Electronically Signed   By: Rubye Oaks M.D.   On: 04/02/2016 04:39   [place] Art/ven Flow Abd Pelv Doppler Limited  Result Date: 04/02/2016 CLINICAL DATA:  46 y/o F; right lower quadrant pelvic pain for 1 day. EXAM: TRANSABDOMINAL AND TRANSVAGINAL ULTRASOUND OF PELVIS DOPPLER ULTRASOUND OF OVARIES TECHNIQUE: Both transabdominal and transvaginal ultrasound examinations of the pelvis  were  performed. Transabdominal technique was performed for global imaging of the pelvis including uterus, ovaries, adnexal regions, and pelvic cul-de-sac. It was necessary to proceed with endovaginal exam following the transabdominal exam to visualize the bilateral ovaries. Color and duplex Doppler ultrasound was utilized to evaluate blood flow to the ovaries. COMPARISON:  04/02/2016 CT of the abdomen and pelvis. FINDINGS: Uterus Measurements: 6.8 x 3.1 x 4.6 cm. No fibroids are mass identified. Nabothian cysts. IUD in situ appears well seated. Endometrium Thickness: 6.7 mm.  No focal abnormality visualized. Right ovary Measurements: 2.7 x 1.4 x 2.4 cm. There are 2 simple appearing cysts 1 of which measures up to 1.0 cm and the other measuring up to 1.1 cm. Left ovary Measurements: 2.9 x 2.3 x 2.6 cm. There is a cyst measuring up to 2.4 cm with internal echoes. Pulsed Doppler evaluation of both ovaries demonstrates normal low-resistance arterial and venous waveforms. Other findings Moderate volume of free fluid in the pelvis as seen on prior CT. IMPRESSION: 1. Left ovarian cyst with internal echoes measuring up to 2.4 cm suggestive, but not classic for hemorrhagic cyst. 6-12 week follow-up ultrasound is recommended to ensure resolution. This recommendation follows the consensus statement: Management of Asymptomatic Ovarian and Other Adnexal Cysts Imaged at [place]: Society of Radiologists in Ultrasound Consensus Conference Statement. Radiology 2010; [phone]. 2. No evidence for ovarian torsion at this time. 3. Moderate volume of free fluid in the pelvis as seen on prior CT. 4. Otherwise unremarkable pelvic ultrasound. Electronically Signed   By: Mitzi Hansen M.D.   On: 04/02/2016 06:42    Anti-infectives: Anti-infectives    Start     Dose/Rate Route Frequency Ordered Stop   04/02/16 0815  cefTRIAXone (ROCEPHIN) 2 g in dextrose 5 % 50 mL IVPB     2 g 100 mL/hr over 30 Minutes Intravenous Every 24 hours  04/02/16 0807     04/02/16 0815  metroNIDAZOLE (FLAGYL) IVPB 500 mg     500 mg 100 mL/hr over 60 Minutes Intravenous Every 8 hours 04/02/16 0807        Assessment/Plan: s/p Procedure(s): APPENDECTOMY LAPAROSCOPIC Doing well postoperatively. Okay for discharge.   LOS: 0 days    Laquon Emel T 3/30/2018Patient ID: Tracey Kane, female   DOB: 17-Jun-1970, 46 y.o.   MRN: [id number]

## 2016-04-03 NOTE — Discharge Instructions (Signed)
[facility] ______[facility] LAPAROSCOPIC SURGERY: POST OP INSTRUCTIONS Always review your discharge instruction sheet given to you by the facility where your surgery was performed. IF YOU HAVE DISABILITY OR FAMILY LEAVE FORMS, YOU MUST BRING THEM TO THE OFFICE FOR PROCESSING.   DO NOT GIVE THEM TO YOUR DOCTOR.  1. A prescription for pain medication may be given to you upon discharge.  Take your pain medication as prescribed, if needed.  If narcotic pain medicine is not needed, then you may take acetaminophen (Tylenol) or ibuprofen (Advil) as needed. 2. Take your usually prescribed medications unless otherwise directed. 3. If you need a refill on your pain medication, please contact your pharmacy.  They will contact our office to request authorization. Prescriptions will not be filled after 5pm or on week-ends. 4. You should follow a light diet the first few days after arrival home, such as soup and crackers, etc.  Be sure to include lots of fluids daily. 5. Most patients will experience some swelling and bruising in the area of the incisions.  Ice packs will help.  Swelling and bruising can take several days to resolve.  6. It is common to experience some constipation if taking pain medication after surgery.  Increasing fluid intake and taking a stool softener (such as Colace) will usually help or prevent this problem from occurring.  A mild laxative (Milk of Magnesia or Miralax) should be taken according to package instructions if there are no bowel movements after 48 hours. 7. Unless discharge instructions indicate otherwise, you may remove your bandages 24-48 hours after surgery, and you may shower at that time.  You may have steri-strips (small skin tapes) in place directly over the incision.  These strips should be left on the skin for 7-10 days.  If your surgeon used skin glue on the incision, you may shower in 24 hours.  The glue will flake off over the next 2-3 weeks.  Any sutures or  staples will be removed at the office during your follow-up visit. 8. ACTIVITIES:  You may resume regular (light) daily activities beginning the next day--such as daily self-care, walking, climbing stairs--gradually increasing activities as tolerated.  You may have sexual intercourse when it is comfortable.  Refrain from any heavy lifting or straining until approved by your doctor. a. You may drive when you are no longer taking prescription pain medication, you can comfortably wear a seatbelt, and you can safely maneuver your car and apply brakes. b. RETURN TO WORK:  __Patient was admitted to the hospital on 04/01/2016 and will be unable to work until April 16 due to surgery ________________________________________________________ 9. You should see your doctor in the office for a follow-up appointment approximately 2-3 weeks after your surgery.  Make sure that you call for this appointment within a day or two after you arrive home to insure a convenient appointment time. 10. OTHER INSTRUCTIONS: __________________________________________________________________________________________________________________________ __________________________________________________________________________________________________________________________ WHEN TO CALL YOUR DOCTOR: 1. Fever over 101.0 2. Inability to urinate 3. Continued bleeding from incision. 4. Increased pain, redness, or drainage from the incision. 5. Increasing abdominal pain  The clinic staff is available to answer your questions during regular business hours.  Please dont hesitate to call and ask to speak to one of the nurses for clinical concerns.  If you have a medical emergency, go to the nearest emergency room or call 911.  A surgeon from [facility] is always on call at the hospital. [address], [address], [place], [place]  [address] ? [address], [place], [place]   [address] [phone] ? [phone] ? FAX [phone]  [phone] Web site: [email or web address]

## 2016-06-10 ENCOUNTER — Ambulatory Visit (INDEPENDENT_AMBULATORY_CARE_PROVIDER_SITE_OTHER): Payer: Self-pay | Admitting: Physician Assistant

## 2016-06-10 ENCOUNTER — Encounter (INDEPENDENT_AMBULATORY_CARE_PROVIDER_SITE_OTHER): Payer: Self-pay | Admitting: Physician Assistant

## 2016-06-10 VITALS — BP 128/82 | HR 66 | Temp 98.0°F | Ht 61.0 in | Wt 156.4 lb

## 2016-06-10 DIAGNOSIS — B86 Scabies: Secondary | ICD-10-CM

## 2016-06-10 MED ORDER — TRIAMCINOLONE ACETONIDE 0.5 % EX CREA: 1.0000 "application " | TOPICAL_CREAM | Freq: Two times a day (BID) | CUTANEOUS | 0 refills | Status: AC

## 2016-06-10 MED ORDER — PERMETHRIN 5 % EX CREA: 1.0000 "application " | TOPICAL_CREAM | Freq: Once | CUTANEOUS | 1 refills | Status: AC

## 2016-06-10 MED ORDER — HYDROXYZINE HCL 25 MG PO TABS: 25.0000 mg | ORAL_TABLET | Freq: Two times a day (BID) | ORAL | 0 refills | Status: DC

## 2016-06-10 NOTE — Progress Notes (Signed)
Subjective:  Patient ID: Tracey Kane, female    DOB: 1970-07-29  Age: 46 y.o. MRN: [id number]  CC: rash  HPI Tracey Kane is a 46 y.o. female with a PMH of asthma and appendectomy presents for rash. She says son had a trip to [place] and came back with multiple erythematous and intensely pruritic lesions on body. Pt feels that she is beginning to have a similar rash. Rash is currently on knees, groin, lower abdomen, buttocks, and forearms. Son's rash eventually resolved with Permethrin and cetirizine. Patient denies any signs of cellulitis, induration, streaking, suppuration, or bleeding. Denies CP, palpitations, SOB, HA, f/c/n/v, or GI/GU sxs.    ROS Review of Systems  Constitutional: Negative for chills, fever and malaise/fatigue.  Eyes: Negative for blurred vision.  Respiratory: Negative for shortness of breath.   Cardiovascular: Negative for chest pain and palpitations.  Gastrointestinal: Negative for abdominal pain and nausea.  Genitourinary: Negative for dysuria and hematuria.  Musculoskeletal: Negative for joint pain and myalgias.  Skin: Positive for rash.  Neurological: Negative for tingling and headaches.  Psychiatric/Behavioral: Negative for depression. The patient is not nervous/anxious.     Objective:  BP 128/82 (BP Location: Left Arm, Patient Position: Sitting, Cuff Size: Large)   Pulse 66   Temp 98 F (36.7 C) (Oral)   Ht 5\' 1"  (1.549 m)   Wt 156 lb 6.4 oz (70.9 kg)   SpO2 96%   BMI 29.55 kg/m   BP/Weight 06/10/2016 04/03/2016 04/02/2016  Systolic BP 128 98 -  Diastolic BP 82 53 -  Wt. (Lbs) 156.4 - 153  BMI 29.55 - 26.26      Physical Exam  Constitutional: She is oriented to person, place, and time.  Well developed, overweight, NAD, polite  HENT:  Head: Normocephalic and atraumatic.  Eyes: No scleral icterus.  Musculoskeletal: She exhibits no edema.  Neurological: She is alert and oriented to person, place, and time. No cranial nerve deficit. Coordination  normal.  Skin: Skin is warm and dry. No rash noted. No erythema. No pallor.  Multiple small erythematous papules distributed sparsely on lower extremities, lower buttocks, lower abdomen, and forearms.   Psychiatric: She has a normal mood and affect. Her behavior is normal. Thought content normal.  Vitals reviewed.    Assessment & Plan:   There are no diagnoses linked to this encounter.  Meds ordered this encounter  Medications  . permethrin (ELIMITE) 5 % cream    Sig: Apply 1 application topically once.    Dispense:  60 g    Refill:  1    Order Specific Question:   Supervising Provider    Answer:   Quentin AngstJEGEDE, OLUGBEMIGA E [id number]]  . hydrOXYzine (ATARAX/VISTARIL) 25 MG tablet    Sig: Take 1 tablet (25 mg total) by mouth 2 (two) times daily.    Dispense:  30 tablet    Refill:  0    Order Specific Question:   Supervising Provider    Answer:   Quentin AngstJEGEDE, OLUGBEMIGA E [id number]]  . triamcinolone cream (KENALOG) 0.5 %    Sig: Apply 1 application topically 2 (two) times daily. Do not use for more than 7 consecutive days without medical approval.    Dispense:  30 g    Refill:  0    Order Specific Question:   Supervising Provider    Answer:   Quentin AngstJEGEDE, OLUGBEMIGA E [[id number]]    Follow-up: Return if symptoms worsen or fail to improve.   Loletta Specteroger David Gomez PA

## 2016-06-10 NOTE — Patient Instructions (Signed)
Sarna en los adultos  (Scabies, Adult)  La sarna es una afección cutánea que se produce cuando insectos muy pequeños se introducen debajo de la piel (infestación). Esto causa una erupción cutánea y picazón intensa. La sarna puede transmitirse de una persona a otra (es contagiosa). Si una persona tiene sarna, es frecuente que los demás miembros de la familia también contraigan la afección.  Los síntomas suelen desaparecer en el término de 2 a 4 semanas con el tratamiento adecuado. Por lo general, la sarna no causa problemas crónicos.  CAUSAS  La causa de esta afección son los ácaros (Sarcoptes scabiei o aradores de la sarna) que pueden verse solamente con un microscopio. Los ácaros se introducen en la capa superior de la piel y ponen huevos. La sarna puede transmitirse de una persona a otra a través de lo siguiente:  · El contacto cercano con una persona que tiene sarna.  · El contacto con objetos infestados, como toallas, ropa de cama o prendas de vestir.  FACTORES DE RIESGO  Es más probable que esta afección se manifieste en:  · Las personas que viven en hogares de ancianos y centros de cuidados prolongados.  · Las personas que tienen contacto sexual con un compañero que tiene sarna.  · Los niños pequeños que asisten a guarderías.  · Las personas que cuidan a otras personas con un riesgo mayor de tener sarna.  SÍNTOMAS  Los síntomas de esta afección pueden incluir lo siguiente:  · Picazón intensa. La picazón generalmente empeora por la noche.  · Una erupción cutánea con pequeños bultos rojos o ampollas. La erupción cutánea suele aparecer en la muñeca, el codo, la axila, los dedos de la mano, la cintura, la ingle o los glúteos. Los bultos pueden formar una línea (surco) en algunas zonas.  · Irritación de la piel. Esta puede incluir úlceras o manchas escamosas.  DIAGNÓSTICO  Esta afección se diagnostica mediante un examen físico. El médico le examinará exhaustivamente la piel. En algunos casos, el médico puede tomar  una muestra de la piel afectada (raspado de la piel) y la hará examinar con un microscopio.  TRATAMIENTO  El tratamiento de esta afección puede incluir lo siguiente:  · Crema o loción con un medicamento para destruir los ácaros. Este producto se esparce por todo el cuerpo y se deja durante varias horas. Por lo general, un tratamiento con crema o loción con medicamento es suficiente para destruir todos los ácaros. Cuando los casos son graves, puede que sea necesario repetir el tratamiento.  · Crema con medicamento para aliviar la picazón.  · Medicamentos que ayudan a aliviar la picazón.  · Medicamentos que destruyen los ácaros. Este tratamiento se utiliza en contadas ocasiones.  INSTRUCCIONES PARA EL CUIDADO EN EL HOGAR  Medicamentos  · Tome o aplíquese los medicamentos de venta libre y los recetados como se lo haya indicado el médico.  · Aplique la crema o loción con medicamento como se lo haya indicado el médico.  · No enjuague la crema o loción con medicamento hasta tanto haya transcurrido el tiempo necesario.  Cuidado de la piel  · No se rasque la piel afectada.  · Mantenga bien cortas las uñas de las manos para reducir las lesiones que se producen al rascarse.  · Para aliviar la picazón, tome baños fríos o aplíquese paños fríos en la piel.  Instrucciones generales  · Lave todos los objetos con los que haya tenido contacto reciente, entre ellos, la ropa de cama, las prendas de vestir y   los muebles. Haga esto el mismo día que comience el tratamiento.  ? Lave los objetos con agua caliente.  ? Coloque en bolsas de plástico herméticas durante al menos 3 días los objetos que no se puedan lavar. Los ácaros no sobreviven más de 3 días alejados de la piel humana.  ? Pase la aspiradora por los muebles y los colchones que utiliza.  · Asegúrese de que un médico examine a las demás personas que puedan haberse infestado. Esto incluye a los miembros de su familia y a cualquier  persona que pueda haber tenido contacto con los objetos infestados.  · Concurra a todas las visitas de control como se lo haya indicado el médico. Esto es importante.  SOLICITE ATENCIÓN MÉDICA SI:  · La picazón no desaparece después de 4 semanas de tratamiento.  · Le siguen apareciendo nuevos bultos o surcos.  · Tiene enrojecimiento, hinchazón o dolor en la zona de la erupción cutánea después del tratamiento.  · Observa líquido, sangre o pus que salen de la erupción cutánea.  Esta información no tiene como fin reemplazar el consejo del médico. Asegúrese de hacerle al médico cualquier pregunta que tenga.  Document Released: 09/12/2014 Document Revised: 09/12/2014 Document Reviewed: 07/24/2014  Elsevier Interactive Patient Education © 2018 [employer]

## 2016-07-03 ENCOUNTER — Ambulatory Visit (INDEPENDENT_AMBULATORY_CARE_PROVIDER_SITE_OTHER): Payer: Self-pay

## 2016-12-04 ENCOUNTER — Emergency Department (HOSPITAL_COMMUNITY)
Admission: EM | Admit: 2016-12-04 | Discharge: 2016-12-05 | Disposition: A | Discharge status: Home / Self Care | Payer: Self-pay | Attending: Emergency Medicine | Admitting: Emergency Medicine

## 2016-12-04 ENCOUNTER — Encounter (HOSPITAL_COMMUNITY): Payer: Self-pay | Admitting: Nurse Practitioner

## 2016-12-04 DIAGNOSIS — J45909 Unspecified asthma, uncomplicated: Secondary | ICD-10-CM | POA: Insufficient documentation

## 2016-12-04 DIAGNOSIS — Y999 Unspecified external cause status: Secondary | ICD-10-CM | POA: Insufficient documentation

## 2016-12-04 DIAGNOSIS — M542 Cervicalgia: Secondary | ICD-10-CM | POA: Insufficient documentation

## 2016-12-04 DIAGNOSIS — Y939 Activity, unspecified: Secondary | ICD-10-CM | POA: Insufficient documentation

## 2016-12-04 DIAGNOSIS — Y929 Unspecified place or not applicable: Secondary | ICD-10-CM | POA: Insufficient documentation

## 2016-12-04 LAB — I-STAT CHEM 8, ED
BUN: 14 mg/dL (ref 6–20)
CHLORIDE: 107 mmol/L (ref 101–111)
Calcium, Ion: 1.18 mmol/L (ref 1.15–1.40)
Creatinine, Ser: 0.6 mg/dL (ref 0.44–1.00)
Glucose, Bld: 101 mg/dL — ABNORMAL HIGH (ref 65–99)
HEMATOCRIT: 39 % (ref 36.0–46.0)
HEMOGLOBIN: 13.3 g/dL (ref 12.0–15.0)
POTASSIUM: 3.6 mmol/L (ref 3.5–5.1)
SODIUM: 141 mmol/L (ref 135–145)
TCO2: 25 mmol/L (ref 22–32)

## 2016-12-04 NOTE — ED Notes (Signed)
ED Provider at bedside. 
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
## 2016-12-05 ENCOUNTER — Emergency Department (HOSPITAL_COMMUNITY): Payer: Self-pay

## 2016-12-05 ENCOUNTER — Encounter (HOSPITAL_COMMUNITY): Payer: Self-pay | Admitting: Radiology

## 2016-12-05 MED ORDER — IBUPROFEN 800 MG PO TABS: 800.0000 mg | ORAL_TABLET | Freq: Three times a day (TID) | ORAL | 0 refills | Status: DC

## 2016-12-05 MED ORDER — IOPAMIDOL (ISOVUE-300) INJECTION 61%
INTRAVENOUS | Status: AC
  Filled 2016-12-05: qty 75

## 2016-12-05 MED ORDER — IOPAMIDOL (ISOVUE-300) INJECTION 61%
75.0000 mL | Freq: Once | INTRAVENOUS | Status: AC | PRN
  Administered 2016-12-05: 75 mL via INTRAVENOUS

## 2016-12-15 ENCOUNTER — Encounter (INDEPENDENT_AMBULATORY_CARE_PROVIDER_SITE_OTHER): Payer: Self-pay | Admitting: Physician Assistant

## 2019-03-15 ENCOUNTER — Ambulatory Visit: Payer: Self-pay

## 2019-03-20 ENCOUNTER — Ambulatory Visit: Payer: Self-pay | Attending: Internal Medicine

## 2019-03-20 DIAGNOSIS — Z20822 Contact with and (suspected) exposure to covid-19: Secondary | ICD-10-CM | POA: Insufficient documentation

## 2019-03-21 LAB — NOVEL CORONAVIRUS, NAA: SARS-CoV-2, NAA: NOT DETECTED
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
## 2019-07-28 ENCOUNTER — Ambulatory Visit
Admission: RE | Admit: 2019-07-28 | Discharge: 2019-07-28 | Disposition: A | Discharge status: Home / Self Care | Payer: Self-pay | Source: Ambulatory Visit | Attending: Physician Assistant | Admitting: Physician Assistant

## 2019-07-28 ENCOUNTER — Other Ambulatory Visit: Payer: Self-pay

## 2019-07-28 VITALS — BP 129/81 | HR 76 | Temp 98.8°F | Resp 18

## 2019-07-28 DIAGNOSIS — J4521 Mild intermittent asthma with (acute) exacerbation: Secondary | ICD-10-CM

## 2019-07-28 DIAGNOSIS — R0981 Nasal congestion: Secondary | ICD-10-CM

## 2019-07-28 DIAGNOSIS — R05 Cough: Secondary | ICD-10-CM

## 2019-07-28 DIAGNOSIS — R059 Cough, unspecified: Secondary | ICD-10-CM

## 2019-07-28 MED ORDER — FLUTICASONE PROPIONATE 50 MCG/ACT NA SUSP: 2.0000 | Freq: Every day | NASAL | 0 refills | Status: DC

## 2019-07-28 MED ORDER — ALBUTEROL SULFATE HFA 108 (90 BASE) MCG/ACT IN AERS: 2.0000 | INHALATION_SPRAY | Freq: Four times a day (QID) | RESPIRATORY_TRACT | 0 refills | Status: DC | PRN

## 2019-07-28 MED ORDER — BENZONATATE 200 MG PO CAPS: 200.0000 mg | ORAL_CAPSULE | Freq: Three times a day (TID) | ORAL | 0 refills | Status: DC

## 2019-07-28 MED ORDER — DEXAMETHASONE SODIUM PHOSPHATE 10 MG/ML IJ SOLN
10.0000 mg | Freq: Once | INTRAMUSCULAR | Status: AC
  Administered 2019-07-28: 10 mg via INTRAMUSCULAR

## 2019-07-28 NOTE — Discharge Instructions (Signed)
COVID PCR testing ordered. I would like you to quarantine until testing results. Tessalon for cough. Albuterol as needed for cough/shortness of breath. Start flonase nasal spray for nasal congestion/drainage. You can use over the counter nasal saline rinse such as neti pot for nasal congestion. Keep hydrated, your urine should be clear to pale yellow in color. Tylenol/motrin for fever and pain. Monitor for any worsening of symptoms, chest pain, shortness of breath, wheezing, swelling of the throat, go to the emergency department for further evaluation needed.

## 2019-07-28 NOTE — ED Triage Notes (Signed)
Pt c/o cough, sore throat, and SOB x20 days. States hx asthma, has had an asthma attack yesterday. States was getting better until exposure to a co-worker with the flu.

## 2019-07-28 NOTE — ED Provider Notes (Signed)
[facility]    CSN: [id number] Arrival date & time: 07/28/19  1707      History   Chief Complaint Chief Complaint  Patient presents with  . appt at 5pm - cough/sore throat/SOB    HPI Tracey Kane is a 49 y.o. female.   49 year old female with history of asthma comes in for URI symptoms. HPI obtained by patient through video translator. States for the past 2-3 weeks had cough, shortness of breath, asthma exacerbation. States symptoms slightly improved last week, but after a few days, worsening symptoms. Mild rhinorrhea, nasal congestion. Denies fever, chills, body aches. Denies abdominal pain, nausea, vomiting, diarrhea. Denies loss of taste/smell. Shortness of breath with relief   using albuterol, but ran out of medicines. Never smoker.      Past Medical History:  Diagnosis Date  . Asthma     Patient Active Problem List   Diagnosis Date Noted  . Appendicitis 04/02/2016  . Ulcer of toe of left foot (HCC) 06/10/2010  . 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 ASTHMA, INTERMITTENT, MODERATE 11/22/2006  . GASTROESOPHAGEAL REFLUX DISEASE 10/20/2006    Past Surgical History:  Procedure Laterality Date  . knee sx    . LAPAROSCOPIC APPENDECTOMY N/A 04/02/2016   Procedure: APPENDECTOMY LAPAROSCOPIC;  Surgeon: Violeta Gelinas, MD;  Location: [facility] OR;  Service: General;  Laterality: N/A;    OB History   No obstetric history on file.      Home Medications    Prior to Admission medications   Medication Sig Start Date End Date Taking? Authorizing Provider  albuterol (VENTOLIN HFA) 108 (90 Base) MCG/ACT inhaler Inhale 2 puffs into the lungs every 6 (six) hours as needed for wheezing or shortness of breath. 07/28/19   Cathie Hoops, Luvenia Cranford V, PA-C  benzonatate (TESSALON) 200 MG capsule Take 1 capsule (200 mg total) by mouth every 8 (eight) hours. 07/28/19   Cathie Hoops, Demont Linford V, PA-C  fluticasone (FLONASE) 50 MCG/ACT nasal spray Place 2 sprays into both  nostrils daily. 07/28/19   Cathie Hoops, Kimbrely Buckel V, PA-C  loratadine (CLARITIN) 10 MG tablet Take 10 mg by mouth daily as needed for allergies. Please print label in spanish     [provider]    Family History Family History  Problem Relation Age of Onset  . Heart disease Father   . Hyperlipidemia Father   . Hypertension Father   . Diabetes Father     Social History Social History   Tobacco Use  . Smoking status: Never Smoker  . Smokeless tobacco: Never Used  Vaping Use  . Vaping Use: Never used  Substance Use Topics  . Alcohol use: No  . Drug use: No     Allergies   Azithromycin and Norgestimate-eth estradiol   Review of Systems Review of Systems  Reason unable to perform ROS: See HPI as above.     Physical Exam Triage Vital Signs ED Triage Vitals  Enc Vitals Group     BP 07/28/19 1729 (!) 129/81     Pulse Rate 07/28/19 1729 76     Resp 07/28/19 1729 18     Temp 07/28/19 1729 98.8 F (37.1 C)     Temp Source 07/28/19 1729 Oral     SpO2 07/28/19 1729 96 %     Weight --      Height --      Head Circumference --      Peak Flow --  Pain Score 07/28/19 1730 9     Pain Loc --      Pain Edu? --      Excl. in GC? --    No data found.  Updated Vital Signs BP (!) 129/81 (BP Location: Left Arm)   Pulse 76   Temp 98.8 F (37.1 C) (Oral)   Resp 18   SpO2 96%   Physical Exam Constitutional:      General: She is not in acute distress.    Appearance: Normal appearance. She is well-developed. She is not toxic-appearing or diaphoretic.  HENT:     Head: Normocephalic and atraumatic.     Nose:     Right Sinus: Maxillary sinus tenderness present. No frontal sinus tenderness.     Left Sinus: Maxillary sinus tenderness present. No frontal sinus tenderness.     Mouth/Throat:     Mouth: Mucous membranes are moist.     Pharynx: Oropharynx is clear. Uvula midline.  Eyes:     Conjunctiva/sclera: Conjunctivae normal.     Pupils: Pupils are equal, round, and  reactive to light.  Pulmonary:     Effort: Pulmonary effort is normal. No respiratory distress.     Comments: LCTAB Musculoskeletal:     Cervical back: Normal range of motion and neck supple.  Skin:    General: Skin is warm and dry.  Neurological:     Mental Status: She is alert and oriented to person, place, and time.    UC Treatments / Results  Labs (all labs ordered are listed, but only abnormal results are displayed) Labs Reviewed  NOVEL CORONAVIRUS, NAA    EKG   Radiology No results found.  Procedures Procedures (including critical care time)  Medications Ordered in UC Medications  dexamethasone (DECADRON) injection 10 mg (10 mg Intramuscular Given 07/28/19 1813)    Initial Impression / Assessment and Plan / UC Course  I have reviewed the triage vital signs and the nursing notes.  Pertinent labs & imaging results that were available during my care of the patient were reviewed by me and considered in my medical decision making (see chart for details).    Patient afebrile, nontoxic in appearance.  Lungs clear to auscultation bilaterally without adventitious lung sounds.  Able to speak in full sentences without difficulty.  However, does have frequent cough throughout exam.  Will provide Decadron injection in office today.  Covid testing ordered.  Patient to quarantine until testing results return.  Albuterol inhaler refilled.  Other symptomatic treatment discussed.  Return precautions given.  Patient expresses understanding and agrees to plan.  Final Clinical Impressions(s) / UC Diagnoses   Final diagnoses:  Cough  Nasal congestion  Mild intermittent asthma with acute exacerbation    ED Prescriptions    Medication Sig Dispense Auth. Provider   albuterol (VENTOLIN HFA) 108 (90 Base) MCG/ACT inhaler  (Status: Discontinued) Inhale 2 puffs into the lungs every 6 (six) hours as needed for wheezing or shortness of breath. 18 g Firman Petrow V, PA-C   fluticasone (FLONASE) 50  MCG/ACT nasal spray Place 2 sprays into both nostrils daily. 1 g Loda Bialas V, PA-C   benzonatate (TESSALON) 200 MG capsule Take 1 capsule (200 mg total) by mouth every 8 (eight) hours. 21 capsule Ahriana Gunkel V, PA-C   albuterol (VENTOLIN HFA) 108 (90 Base) MCG/ACT inhaler Inhale 2 puffs into the lungs every 6 (six) hours as needed for wheezing or shortness of breath. 18 g Belinda Fisher, PA-C  PDMP not reviewed this encounter.   Belinda Fisher, PA-C 07/28/19 1818

## 2019-07-30 LAB — NOVEL CORONAVIRUS, NAA: SARS-CoV-2, NAA: NOT DETECTED

## 2019-07-30 LAB — SARS-COV-2, NAA 2 DAY TAT

## 2020-01-01 ENCOUNTER — Ambulatory Visit
Admission: EM | Admit: 2020-01-01 | Discharge: 2020-01-01 | Disposition: A | Discharge status: Home / Self Care | Payer: Self-pay | Attending: Urgent Care | Admitting: Urgent Care

## 2020-01-01 ENCOUNTER — Other Ambulatory Visit: Payer: Self-pay

## 2020-01-01 DIAGNOSIS — J4530 Mild persistent asthma, uncomplicated: Secondary | ICD-10-CM

## 2020-01-01 DIAGNOSIS — J069 Acute upper respiratory infection, unspecified: Secondary | ICD-10-CM

## 2020-01-01 DIAGNOSIS — Z1152 Encounter for screening for COVID-19: Secondary | ICD-10-CM

## 2020-01-01 DIAGNOSIS — J3489 Other specified disorders of nose and nasal sinuses: Secondary | ICD-10-CM

## 2020-01-01 MED ORDER — PREDNISONE 20 MG PO TABS: ORAL_TABLET | ORAL | 0 refills | Status: DC

## 2020-01-01 MED ORDER — ALBUTEROL SULFATE HFA 108 (90 BASE) MCG/ACT IN AERS: 2.0000 | INHALATION_SPRAY | Freq: Four times a day (QID) | RESPIRATORY_TRACT | 0 refills | Status: DC | PRN

## 2020-01-01 MED ORDER — CETIRIZINE HCL 10 MG PO TABS: 10.0000 mg | ORAL_TABLET | Freq: Every day | ORAL | 0 refills | Status: AC

## 2020-01-01 MED ORDER — ACETAMINOPHEN 325 MG PO TABS
650.0000 mg | ORAL_TABLET | Freq: Once | ORAL | Status: AC
  Administered 2020-01-01: 650 mg via ORAL

## 2020-01-01 NOTE — Discharge Instructions (Addendum)
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
## 2020-01-01 NOTE — ED Triage Notes (Signed)
Intake completed by provider °

## 2020-01-03 LAB — NOVEL CORONAVIRUS, NAA: SARS-CoV-2, NAA: NOT DETECTED

## 2020-01-03 LAB — SARS-COV-2, NAA 2 DAY TAT

## 2020-06-12 ENCOUNTER — Encounter: Payer: Self-pay | Admitting: Emergency Medicine

## 2020-06-12 ENCOUNTER — Ambulatory Visit
Admission: EM | Admit: 2020-06-12 | Discharge: 2020-06-12 | Disposition: A | Discharge status: Home / Self Care | Payer: Self-pay | Attending: Student | Admitting: Student

## 2020-06-12 ENCOUNTER — Other Ambulatory Visit: Payer: Self-pay

## 2020-06-12 DIAGNOSIS — Z760 Encounter for issue of repeat prescription: Secondary | ICD-10-CM

## 2020-06-12 DIAGNOSIS — J301 Allergic rhinitis due to pollen: Secondary | ICD-10-CM

## 2020-06-12 DIAGNOSIS — J4521 Mild intermittent asthma with (acute) exacerbation: Secondary | ICD-10-CM

## 2020-06-12 MED ORDER — ALBUTEROL SULFATE HFA 108 (90 BASE) MCG/ACT IN AERS: 1.0000 | INHALATION_SPRAY | Freq: Four times a day (QID) | RESPIRATORY_TRACT | 0 refills | Status: DC | PRN

## 2020-06-12 MED ORDER — PREDNISONE 10 MG (21) PO TBPK: ORAL_TABLET | Freq: Every day | ORAL | 0 refills | Status: DC

## 2020-06-12 NOTE — ED Provider Notes (Signed)
[facility]    CSN: [id number] Arrival date & time: 06/12/20  1558      History   Chief Complaint Chief Complaint  Patient presents with  . Shortness of Breath  . Asthma    HPI Tracey Kane is a 50 y.o. female presenting with asthma exacerbation x3 weeks.  Medical history asthma that is typically well controlled on albuterol inhaler.  Also medical history allergic rhinitis that is typically well controlled on cetirizine.  Notes 3 weeks of increased cough and wheezing following new house.  She thinks that there is mold inside.  Endorses for asthma attacks today and 2 yesterday.  Denies recent URI, fever/chills.    HPI  Past Medical History:  Diagnosis Date  . Asthma     Patient Active Problem List   Diagnosis Date Noted  . Appendicitis 04/02/2016  . Ulcer of toe of left foot (HCC) 06/10/2010  . 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 ASTHMA, INTERMITTENT, MODERATE 11/22/2006  . GASTROESOPHAGEAL REFLUX DISEASE 10/20/2006    Past Surgical History:  Procedure Laterality Date  . knee sx    . LAPAROSCOPIC APPENDECTOMY N/A 04/02/2016   Procedure: APPENDECTOMY LAPAROSCOPIC;  Surgeon: Violeta Gelinas, MD;  Location: [facility] OR;  Service: General;  Laterality: N/A;    OB History   No obstetric history on file.      Home Medications    Prior to Admission medications   Medication Sig Start Date End Date Taking? Authorizing Provider  albuterol (VENTOLIN HFA) 108 (90 Base) MCG/ACT inhaler Inhale 1-2 puffs into the lungs every 6 (six) hours as needed for wheezing or shortness of breath. 06/12/20  Yes Rhys Martini, PA-C  predniSONE (STERAPRED UNI-PAK 21 TAB) 10 MG (21) TBPK tablet Take by mouth daily. Take 6 tabs by mouth daily  for 2 days, then 5 tabs for 2 days, then 4 tabs for 2 days, then 3 tabs for 2 days, 2 tabs for 2 days, then 1 tab by mouth daily for 2 days 06/12/20  Yes Cheree Ditto, Lyman Speller, PA-C  cetirizine (ZYRTEC ALLERGY) 10  MG tablet Take 1 tablet (10 mg total) by mouth daily. 01/01/20   Wallis Bamberg, PA-C  fluticasone (FLONASE) 50 MCG/ACT nasal spray Place 2 sprays into both nostrils daily. 07/28/19 01/01/20  Belinda Fisher, PA-C  loratadine (CLARITIN) 10 MG tablet Take 10 mg by mouth daily as needed for allergies. Please print label in spanish   01/01/20  [provider]    Family History Family History  Problem Relation Age of Onset  . Heart disease Father   . Hyperlipidemia Father   . Hypertension Father   . Diabetes Father     Social History Social History   Tobacco Use  . Smoking status: Never Smoker  . Smokeless tobacco: Never Used  Vaping Use  . Vaping Use: Never used  Substance Use Topics  . Alcohol use: No  . Drug use: No     Allergies   Azithromycin and Norgestimate-eth estradiol   Review of Systems Review of Systems  Constitutional: Negative for appetite change, chills and fever.  HENT: Negative for congestion, ear pain, rhinorrhea, sinus pressure, sinus pain and sore throat.   Eyes: Negative for redness and visual disturbance.  Respiratory: Positive for cough, shortness of breath and wheezing. Negative for chest tightness.   Cardiovascular: Negative for chest pain and palpitations.  Gastrointestinal: Negative for abdominal pain, constipation, diarrhea, nausea and vomiting.  Genitourinary: Negative for  dysuria, frequency and urgency.  Musculoskeletal: Negative for myalgias.  Neurological: Negative for dizziness, weakness and headaches.  Psychiatric/Behavioral: Negative for confusion.  All other systems reviewed and are negative.    Physical Exam Triage Vital Signs ED Triage Vitals  Enc Vitals Group     BP 06/12/20 1856 (!) 146/90     Pulse Rate 06/12/20 1856 77     Resp 06/12/20 1856 20     Temp 06/12/20 1856 98.4 F (36.9 C)     Temp Source 06/12/20 1856 Oral     SpO2 06/12/20 1856 95 %     Weight --      Height --      Head Circumference --      Peak Flow --       Pain Score 06/12/20 1855 4     Pain Loc --      Pain Edu? --      Excl. in GC? --    No data found.  Updated Vital Signs BP (!) 146/90 (BP Location: Left Arm)   Pulse 77   Temp 98.4 F (36.9 C) (Oral)   Resp 20   SpO2 95%   Visual Acuity Right Eye Distance:   Left Eye Distance:   Bilateral Distance:    Right Eye Near:   Left Eye Near:    Bilateral Near:     Physical Exam Vitals reviewed.  Constitutional:      General: She is not in acute distress.    Appearance: Normal appearance. She is not ill-appearing.  HENT:     Head: Normocephalic and atraumatic.     Right Ear: Hearing, tympanic membrane, ear canal and external ear normal. No swelling or tenderness. There is no impacted cerumen. No mastoid tenderness. Tympanic membrane is not perforated, erythematous, retracted or bulging.     Left Ear: Hearing, tympanic membrane, ear canal and external ear normal. No swelling or tenderness. There is no impacted cerumen. No mastoid tenderness. Tympanic membrane is not perforated, erythematous, retracted or bulging.     Nose:     Right Sinus: No maxillary sinus tenderness or frontal sinus tenderness.     Left Sinus: No maxillary sinus tenderness or frontal sinus tenderness.     Mouth/Throat:     Mouth: Mucous membranes are moist.     Pharynx: Uvula midline. No oropharyngeal exudate or posterior oropharyngeal erythema.     Tonsils: No tonsillar exudate.  Cardiovascular:     Rate and Rhythm: Normal rate and regular rhythm.     Heart sounds: Normal heart sounds.  Pulmonary:     Effort: Pulmonary effort is normal. No tachypnea, bradypnea, accessory muscle usage or prolonged expiration.     Breath sounds: Normal air entry. Decreased breath sounds and wheezing present. No rhonchi or rales.     Comments: Decreased breath sounds and few scattered wheezes throughout. Chest:     Chest wall: No tenderness.  Abdominal:     General: Abdomen is flat. Bowel sounds are normal.      Tenderness: There is no abdominal tenderness. There is no guarding or rebound.  Lymphadenopathy:     Cervical: No cervical adenopathy.  Neurological:     General: No focal deficit present.     Mental Status: She is alert and oriented to person, place, and time.  Psychiatric:        Attention and Perception: Attention and perception normal.        Mood and Affect: Mood and affect normal.  Behavior: Behavior normal. Behavior is cooperative.        Thought Content: Thought content normal.        Judgment: Judgment normal.      UC Treatments / Results  Labs (all labs ordered are listed, but only abnormal results are displayed) Labs Reviewed - No data to display  EKG   Radiology No results found.  Procedures Procedures (including critical care time)  Medications Ordered in UC Medications - No data to display  Initial Impression / Assessment and Plan / UC Course  I have reviewed the triage vital signs and the nursing notes.  Pertinent labs & imaging results that were available during my care of the patient were reviewed by me and considered in my medical decision making (see chart for details).      This patient is a 50 year old female presenting with asthma exacerbation.  Afebrile, nontachycardic, nontachypneic, oxygenating well on room air.  Decreased breath sounds and few scattered wheezes throughout.  IUD for contraception Prednisone taper as below, she is not a diabetic.  Refilled albuterol inhaler.  Continue Claritin for allergic rhinitis component.  Coding this visit a level 4 for chronic condition with acute exacerbation (asthma) with prescription drug management.  Final Clinical Impressions(s) / UC Diagnoses   Final diagnoses:  Mild intermittent asthma with acute exacerbation  Seasonal allergic rhinitis due to pollen  Medication refill     Discharge Instructions     -Prednisone taper for cough/bronchitis. I recommend taking this in the morning as it  could give you energy.  -I refilled your albuterol inhaler, continue this up to every 6 hours for wheezing and shortness of breath. -Continue your allergy medication. -Seek additional medical attention if symptoms worsen or persist, like new fever/chills, dizziness, weakness, chest pain.    ED Prescriptions    Medication Sig Dispense Auth. Provider   predniSONE (STERAPRED UNI-PAK 21 TAB) 10 MG (21) TBPK tablet Take by mouth daily. Take 6 tabs by mouth daily  for 2 days, then 5 tabs for 2 days, then 4 tabs for 2 days, then 3 tabs for 2 days, 2 tabs for 2 days, then 1 tab by mouth daily for 2 days 42 tablet Rhys Martini, PA-C   albuterol (VENTOLIN HFA) 108 (90 Base) MCG/ACT inhaler Inhale 1-2 puffs into the lungs every 6 (six) hours as needed for wheezing or shortness of breath. 1 each Rhys Martini, PA-C     PDMP not reviewed this encounter.   Rhys Martini, PA-C 06/12/20 1916

## 2020-06-12 NOTE — Discharge Instructions (Addendum)
-  Prednisone taper for cough/bronchitis. I recommend taking this in the morning as it could give you energy.  -I refilled your albuterol inhaler, continue this up to every 6 hours for wheezing and shortness of breath. -Continue your allergy medication. -Seek additional medical attention if symptoms worsen or persist, like new fever/chills, dizziness, weakness, chest pain.

## 2020-06-12 NOTE — ED Triage Notes (Addendum)
Pt has asthma and for about 3 week has been using inhaler more, new fan and has mold inside. Pt said she feels SOB and cough. Said had 4 asthma attacks and 2 today.

## 2020-08-29 ENCOUNTER — Other Ambulatory Visit: Payer: Self-pay

## 2020-08-29 ENCOUNTER — Encounter: Payer: Self-pay | Admitting: Family Medicine

## 2020-08-29 ENCOUNTER — Ambulatory Visit (INDEPENDENT_AMBULATORY_CARE_PROVIDER_SITE_OTHER): Payer: Self-pay | Admitting: Family Medicine

## 2020-08-29 ENCOUNTER — Other Ambulatory Visit: Payer: Self-pay | Admitting: Plastic Surgery

## 2020-08-29 ENCOUNTER — Ambulatory Visit (INDEPENDENT_AMBULATORY_CARE_PROVIDER_SITE_OTHER): Payer: Self-pay

## 2020-08-29 VITALS — BP 129/84 | HR 80 | Ht 63.0 in | Wt 166.2 lb

## 2020-08-29 DIAGNOSIS — Z0000 Encounter for general adult medical examination without abnormal findings: Secondary | ICD-10-CM

## 2020-08-29 DIAGNOSIS — Z23 Encounter for immunization: Secondary | ICD-10-CM

## 2020-08-29 DIAGNOSIS — Z1231 Encounter for screening mammogram for malignant neoplasm of breast: Secondary | ICD-10-CM

## 2020-08-29 NOTE — Progress Notes (Signed)
    SUBJECTIVE:   Chief compliant/HPI: annual examination  Tracey Kane is a 50 y.o. who presents today to re-establish care. Spanish interpreter used throughout visit via IPAD.   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 Also wants IUD removed.   PMH Asthma (intermittent)  in 2010. On albuterol as needed- last used 1 month ago.  Takes flonase and Loratidine as needed for allergies  Takes omeprazole 20mg    Social  Denies cigarette use, rec drug use, alcohol   Denies recent pap smear  Denies concern for STDs no vaginal symptoms  Feels safe in [place]   Denies anxiety or derpession   Health maintenance Due for pap smear   Wants COVID vaccine  Send Tetatnus  Discussed colonsocpy  Hep C    OBJECTIVE:   BP 129/84   Pulse 80   Ht 5\' 3"  (1.6 m)   Wt 166 lb 3.2 oz (75.4 kg)   SpO2 99%   BMI 29.44 kg/m    General: alert, pleasant, NAD HEENT: normocephalic, atraumatic. Oropharynx non erythematous and without lesions CV: RRR no murmurs  Resp: CTAB normal WOB GI: soft, non distended Derm: no visible rashes or lesions    ASSESSMENT/PLAN:   No problem-specific Assessment & Plan notes found for this encounter.    Annual Examination  See AVS for age appropriate recommendations.   PHQ score reviewed and discussed.  Blood pressure reviewed and at goal  Asked about intimate partner violence and resources given as appropriate  The patient currently uses IUD for contraception.   Considered the following items based upon USPSTF recommendations: Diabetes screening: ordered Screening for elevated cholesterol: ordered Hepatitis C: ordered GC/CT not at high risk and not ordered. Reviewed risk factors for latent tuberculosis and not indicated  Cervical cancer screening: will perform pap at next visit  Breast cancer screening:  discussed and gave information Colorectal cancer screening: discussed, colonoscopy ordered if age 52 or over.   Follow up in the next couple of weeks for pap and IUD removal, or sooner if indicated.    , DO [facility]

## 2020-08-29 NOTE — Patient Instructions (Addendum)
It was great seeing you today!  Today you came in to re- establish care.   We are checking blood work, including cholesterol, A1c for your blood sugars, hep C. I will call you with any abnormal results.   I have provided the form to schedule your mammogram, and your colonoscopy.  You can get your tetanus shot from your pharmacy.    Please check-out at the front desk before leaving the clinic. I'd like to see you back in the next couple of weeks to remove your IUD, and do your Pap smear, but if you need to be seen earlier than that for any new issues we're happy to fit you in, just give [place] a call!  Visit Reminders: - Stop by the pharmacy to pick up your prescriptions  - Continue to work on your healthy eating habits and incorporating exercise into your daily life.   Regarding lab work today:  Due to recent changes in healthcare laws, you may see the results of your imaging and laboratory studies on MyChart before your provider has had a chance to review them.  I understand that in some cases there may be results that are confusing or concerning to you. Not all laboratory results come back in the same time frame and you may be waiting for multiple results in order to interpret others.  Please give [place] 72 hours in order for your provider to thoroughly review all the results before contacting the office for clarification of your results. If everything is normal, you will get a letter in the mail or a message in My Chart. Please give [place] a call if you do not hear from [place] after 2 weeks.  Please bring all of your medications with you to each visit.   Feel free to call with any questions or concerns at any time, at [phone].   Take care,  Dr. Cora Collum [facility]

## 2020-08-30 LAB — BASIC METABOLIC PANEL
BUN/Creatinine Ratio: 17 (ref 9–23)
BUN: 11 mg/dL (ref 6–24)
CO2: 24 mmol/L (ref 20–29)
Calcium: 9.5 mg/dL (ref 8.7–10.2)
Chloride: 104 mmol/L (ref 96–106)
Creatinine, Ser: 0.65 mg/dL (ref 0.57–1.00)
Glucose: 92 mg/dL (ref 65–99)
Potassium: 3.9 mmol/L (ref 3.5–5.2)
Sodium: 141 mmol/L (ref 134–144)
eGFR: 109 mL/min/{1.73_m2} (ref >59)

## 2020-08-30 LAB — CBC WITH DIFFERENTIAL/PLATELET
Basophils Absolute: 0.1 10*3/uL (ref 0.0–0.2)
Basos: 1 %
EOS (ABSOLUTE): 0.2 10*3/uL (ref 0.0–0.4)
Eos: 4 %
Hematocrit: 38 % (ref 34.0–46.6)
Hemoglobin: 12.7 g/dL (ref 11.1–15.9)
Immature Grans (Abs): 0 10*3/uL (ref 0.0–0.1)
Immature Granulocytes: 0 %
Lymphocytes Absolute: 2.8 10*3/uL (ref 0.7–3.1)
Lymphs: 45 %
MCH: 29.7 pg (ref 26.6–33.0)
MCHC: 33.4 g/dL (ref 31.5–35.7)
MCV: 89 fL (ref 79–97)
Monocytes Absolute: 0.4 10*3/uL (ref 0.1–0.9)
Monocytes: 7 %
Neutrophils Absolute: 2.7 10*3/uL (ref 1.4–7.0)
Neutrophils: 43 %
Platelets: 200 10*3/uL (ref 150–450)
RBC: 4.28 x10E6/uL (ref 3.77–5.28)
RDW: 12.9 % (ref 11.7–15.4)
WBC: 6.2 10*3/uL (ref 3.4–10.8)

## 2020-08-30 LAB — LIPID PANEL
Chol/HDL Ratio: 3.6 ratio (ref 0.0–4.4)
Cholesterol, Total: 186 mg/dL (ref 100–199)
HDL: 51 mg/dL (ref >39)
LDL Chol Calc (NIH): 117 mg/dL — ABNORMAL HIGH (ref 0–99)
Triglycerides: 97 mg/dL (ref 0–149)
VLDL Cholesterol Cal: 18 mg/dL (ref 5–40)

## 2020-08-30 LAB — HCV INTERPRETATION

## 2020-08-30 LAB — HEMOGLOBIN A1C
Est. average glucose Bld gHb Est-mCnc: 140 mg/dL
Hgb A1c MFr Bld: 6.5 % — ABNORMAL HIGH (ref 4.8–5.6)

## 2020-08-30 LAB — HCV AB W REFLEX TO QUANT PCR: HCV Ab: <0.1 s/co ratio (ref 0.0–0.9)

## 2020-09-11 ENCOUNTER — Other Ambulatory Visit: Payer: Self-pay | Admitting: Obstetrics and Gynecology

## 2020-09-11 DIAGNOSIS — Z1231 Encounter for screening mammogram for malignant neoplasm of breast: Secondary | ICD-10-CM

## 2020-09-12 ENCOUNTER — Other Ambulatory Visit: Payer: Self-pay | Admitting: Obstetrics and Gynecology

## 2020-09-12 DIAGNOSIS — Z1231 Encounter for screening mammogram for malignant neoplasm of breast: Secondary | ICD-10-CM

## 2020-09-18 ENCOUNTER — Encounter: Payer: Self-pay | Admitting: Family Medicine

## 2020-09-18 ENCOUNTER — Other Ambulatory Visit: Payer: Self-pay

## 2020-09-18 ENCOUNTER — Other Ambulatory Visit (HOSPITAL_COMMUNITY)
Admission: RE | Admit: 2020-09-18 | Discharge: 2020-09-18 | Disposition: A | Discharge status: Home / Self Care | Payer: Self-pay | Source: Ambulatory Visit | Attending: Family Medicine | Admitting: Family Medicine

## 2020-09-18 ENCOUNTER — Ambulatory Visit (INDEPENDENT_AMBULATORY_CARE_PROVIDER_SITE_OTHER): Payer: Self-pay | Admitting: Family Medicine

## 2020-09-18 VITALS — BP 110/75 | HR 77 | Ht 63.0 in | Wt 163.8 lb

## 2020-09-18 DIAGNOSIS — Z30432 Encounter for removal of intrauterine contraceptive device: Secondary | ICD-10-CM

## 2020-09-18 DIAGNOSIS — Z124 Encounter for screening for malignant neoplasm of cervix: Secondary | ICD-10-CM

## 2020-09-18 NOTE — Progress Notes (Signed)
        SUBJECTIVE:   CHIEF COMPLAINT / HPI:   Vaginal Discharge: Patient is a 50 y.o. female presenting for a pap smear and removal of her IUD. States she has had the IUD in place for 5 years. She is interested in getting it replaced but does not have insurance. She denies any vaginal discharge, odor, itching, or urinary symptoms. Not interested in STI screening.    OBJECTIVE:   BP 110/75   Pulse 77   Ht 5\' 3"  (1.6 m)   Wt 163 lb 12.8 oz (74.3 kg)   SpO2 97%   BMI 29.02 kg/m    General: NAD, pleasant, able to participate in exam Respiratory: Normal effort, no obvious respiratory distress Pelvic: VULVA: normal appearing vulva with no masses, tenderness or lesions, VAGINA: Normal appearing vagina with normal color, no lesions, with scant and white discharge present, CERVIX: No lesions, scant and white discharge present,  Chaperone present for pelvic exam  ASSESSMENT/PLAN:   No problem-specific Assessment & Plan notes found for this encounter.   Pap smear Previous pap in 2013 normal. No vaginal symptoms. Pelvic exam wnl. Declines STI testing.  - f/u cytology   IUD removal  PROCEDURE NOTE: IUD REMOVAL  Patient given informed consent for IUD removal. Signed copy in the chart. Appropriate time out taken. Patient placed in the lithotomy position and the cervix brought into view using speculum. The IUD strings were identified coming from the cervical os. These strings were grasped with ring forceps, and the IUD withdrawn gently from the uterus. There were no complications and no blood loss.   - form given to patient for discounted IUD insertion - patient to follow up if she wants depot or OCPs in the meantime    F/u in 2 months to check A1c   2014, DO [facility]

## 2020-09-18 NOTE — Patient Instructions (Signed)
It was great seeing you today!  Today we did your pap smear and removed your IUD. I will call you if there are any abnormal results, and will send a mychart message for normal ones.   If you decide you need birth control pills let me know and I can send some in. We also gave you a form to get the IUD at a better price since you do not have insurance.   I would like to see you back in 2 months to check on your sugars, but if you need to be seen earlier than that for any new issues we're happy to fit you in, just give [place] a call!  Feel free to call with any questions or concerns at any time, at [phone].   Take care,  Dr. Cora Collum [facility] Center

## 2020-09-19 ENCOUNTER — Ambulatory Visit: Payer: Self-pay | Admitting: Family Medicine

## 2020-09-19 ENCOUNTER — Ambulatory Visit: Payer: Self-pay

## 2020-09-23 LAB — CYTOLOGY - PAP
Comment: NEGATIVE
Diagnosis: NEGATIVE
High risk HPV: NEGATIVE

## 2020-09-24 ENCOUNTER — Encounter: Payer: Self-pay | Admitting: Family Medicine

## 2020-10-17 ENCOUNTER — Ambulatory Visit: Payer: Self-pay

## 2020-12-24 NOTE — Progress Notes (Signed)
° ° °  SUBJECTIVE:   CHIEF COMPLAINT / HPI:   Ear Pain  Tracey Kane is a 50 y.o. female with PMHx of migraine without aura who presents to the family medicine clinic complaining of daily intermittent left and right ear pain since 2 weeks.  Radiates down her jaw occasionally. Has associated congestion, runny nose, sneezing occasionally.  She has been taking Alka-Seltzer for her congestion which seems to help.  Patient notes that there are loud machines at work and she typically wears ear protection daily for the duration of her 12-hour shift.  She typically puts Vaseline on the tips but lately its been hurting when she does this.  She has been off for the last 3 days due to vacation.  She has not had fevers throughout this.  No sore throat, shortness of breath, chest pain, dental pain.  Has an intermittent temporal headache as well.  PERTINENT  PMH / PSH:  Past Medical History:  Diagnosis Date   Asthma    OBJECTIVE:   BP 132/76    Pulse 67    Ht 5\' 3"  (1.6 m)    Wt 156 lb 12.8 oz (71.1 kg)    LMP 12/14/2020    SpO2 97%    BMI 27.78 kg/m    General: NAD, pleasant, able to participate in exam HEENT: Normocephalic, TM clear on right, TM more opaque on left, anatomy is not clearly visualized. Has tenderness on palpation of left tragus. No tenderness to right tragus. No erythema or tenderness on palpation of bilateral mastoid processes. Normal TMJ movement. Cardiac: RRR, no murmurs. Respiratory: CTAB, normal effort, No wheezes, rales or rhonchi. Psych: Normal affect and mood  ASSESSMENT/PLAN:   Otitis externa Patient has risk factors for otitis external such as skin cerumen barrier breakdown given frequent use of earplugs. Examination notable for pain on palpation of tragus. TM not suggestive of otitis media. No fevers. No TMJ dysfunction. No dental abscess or concern. May also have a component of allergies given runny nose, sneezing.  -Treat with Ciprodex drops. 5 drops TID x 10  days -Start Loratidine and flonase daily. Instructed on how to properly use Flonase. -Return precautions given     14/10/2020, DO [facility] Center

## 2020-12-24 NOTE — Patient Instructions (Addendum)
Fue maravilloso conocerte hoy.  Por favor traiga TODOS sus medicamentos a cada visita.  Hoy hablamos de:  Sospechamos que puede tener una infeccin del odo externo. Esto tendr que ser tratado con gotas para los odos, que he enviado a Psychologist, counselling. Use 5 gotas en el odo izquierdo 3 veces al da durante [address] Regrese si no mejora o si los sntomas empeoran. Le animo a que tambin use loratidina y flonasa diariamente.  Karl Pock por elegir Medicina familiar de [place].  Llame al [phone] si tiene alguna pregunta sobre la cita de [place].  Asegrese de programar un seguimiento en la recepcin antes de irse hoy.  Sabino Dick, D.O. PGY-2 Medicina Familiar

## 2020-12-25 ENCOUNTER — Ambulatory Visit (INDEPENDENT_AMBULATORY_CARE_PROVIDER_SITE_OTHER): Payer: Self-pay | Admitting: Family Medicine

## 2020-12-25 ENCOUNTER — Other Ambulatory Visit: Payer: Self-pay

## 2020-12-25 DIAGNOSIS — H60502 Unspecified acute noninfective otitis externa, left ear: Secondary | ICD-10-CM

## 2020-12-25 DIAGNOSIS — H6090 Unspecified otitis externa, unspecified ear: Secondary | ICD-10-CM | POA: Insufficient documentation

## 2020-12-25 MED ORDER — CORTISPORIN-TC 3.3-3-10-0.5 MG/ML OT SUSP: 5.0000 [drp] | Freq: Three times a day (TID) | OTIC | 0 refills | Status: AC

## 2020-12-25 NOTE — Assessment & Plan Note (Signed)
Patient has risk factors for otitis external such as skin cerumen barrier breakdown given frequent use of earplugs. Examination notable for pain on palpation of tragus. TM not suggestive of otitis media. No fevers. No TMJ dysfunction. No dental abscess or concern. May also have a component of allergies given runny nose, sneezing.  -Treat with Ciprodex drops. 5 drops TID x 10 days -Start Loratidine and flonase daily. Instructed on how to properly use Flonase. -Return precautions given

## 2020-12-27 ENCOUNTER — Ambulatory Visit: Payer: Self-pay | Admitting: Family Medicine

## 2021-02-14 ENCOUNTER — Other Ambulatory Visit: Payer: Self-pay

## 2021-02-14 ENCOUNTER — Encounter: Payer: Self-pay | Admitting: Family Medicine

## 2021-02-14 ENCOUNTER — Ambulatory Visit (INDEPENDENT_AMBULATORY_CARE_PROVIDER_SITE_OTHER): Payer: Self-pay | Admitting: Family Medicine

## 2021-02-14 VITALS — BP 120/75 | HR 77 | Wt 160.0 lb

## 2021-02-14 DIAGNOSIS — Z3009 Encounter for other general counseling and advice on contraception: Secondary | ICD-10-CM

## 2021-02-14 LAB — POCT URINE PREGNANCY: Preg Test, Ur: NEGATIVE

## 2021-02-14 MED ORDER — NORETHINDRONE 0.35 MG PO TABS: 1.0000 | ORAL_TABLET | Freq: Every day | ORAL | 11 refills | Status: DC

## 2021-02-14 NOTE — Progress Notes (Signed)
° ° ° °  SUBJECTIVE:   CHIEF COMPLAINT / HPI:   Tracey Kane is a 51 y.o. female presents for birth control  A spanish speaking interpretor was used for this encounter.    Birth control Pt wants to start birth control today. She would like the patch. In the past she has tried IUD and OCP. She had mirena removed recently but now cannot afford to get another mirena.  Has completed child bearing. Is considering tubal ligation in the future. LMP 10th Jan. Cycles are every month.   Flowsheet Row Office Visit from 02/14/2021 in [facility]  PHQ-9 Total Score 0       PERTINENT  PMH / PSH: appendicitis   OBJECTIVE:   BP 120/75    Pulse 77    Wt 160 lb (72.6 kg)    LMP 01/14/2021    SpO2 98%    BMI 28.34 kg/m    General: Alert, no acute distress, pleasant Cardio: well perfused Pulm: normal work of breathing Neuro: Cranial nerves grossly intact   ASSESSMENT/PLAN:   Birth control counseling Discussed birth control options at length. Pt has no insurance so iud, nexplanon would be too expensive. Pt declined depot. Pt has true allergy to estrogen containing products so only option is mini pill for now. No contraindications to this. Urine preg negative. Pt agreeable. Recommended tubal ligation in the future after pt establishes insurance given pt has completed child bearing.    Towanda Octave, MD PGY-3 [facility]

## 2021-02-14 NOTE — Patient Instructions (Addendum)
Thank you for coming to see me today. It was a pleasure. Today we discussed starting the mini pill. You can start this while you wait for IUD at [facility] department.  If you have an allergic reaction stop immediately.   You can also call planned parent hood:  Address: [address] [place], [place] [address] Hours:  Closed ? Opens 11?AM Mon Phone: [phone]  Consider tubal ligation after you get better insurance coverage   Please follow-up as needed   If you have any questions or concerns, please do not hesitate to call the office at [phone].  Best wishes,   Dr Allena Katz

## 2021-02-17 DIAGNOSIS — Z3009 Encounter for other general counseling and advice on contraception: Secondary | ICD-10-CM | POA: Insufficient documentation

## 2021-02-17 NOTE — Assessment & Plan Note (Addendum)
Discussed birth control options at length. Pt has no insurance so iud, nexplanon would be too expensive. Pt declined depot. Pt has true allergy to estrogen containing products so only option is mini pill for now. No contraindications to this. Urine preg negative. Pt agreeable. Recommended tubal ligation in the future after pt establishes insurance given pt has completed child bearing.

## 2021-02-27 NOTE — Progress Notes (Signed)
° ° °  SUBJECTIVE:   CHIEF COMPLAINT / HPI:   Cough and congestion: Ran out of albuterol about 2 weeks ago and cough and congestion have worsened since then. No fever, vomiting or diarrhea.  She notes some wheezing and some chest pain (see below) over the past 2 weeks.  Chest pain: Has been present for 2 weeks. It is present all the time but is worse during bouts of coughing.  She states using albuterol seems to take away the chest pain.  She denies shortness of breath during normal day-to-day activities, only during significant bouts of coughing.   PERTINENT  PMH / PSH: Asthma  OBJECTIVE:   BP 120/82    Pulse 80    Ht 5\' 3"  (1.6 m)    Wt 159 lb 8 oz (72.3 kg)    LMP 02/15/2021    SpO2 98%    BMI 28.25 kg/m    General: NAD, pleasant, able to participate in exam Cardiac: RRR, no murmurs.  Patient does have pain with palpation of the anterior chest wall which reproduces her pain. Respiratory: Poor air movement bilaterally with some faint wheezing and crackles which appear equal bilaterally.  No respiratory distress. Abdomen: Bowel sounds present, nontender Skin: warm and dry, no rashes noted  ASSESSMENT/PLAN:    Cough/congestion in the setting of asthma Assessment: 51 y.o. female presenting with cough and congestion in the setting of running out of her albuterol which she uses for asthma.  She does not have any fevers, no diarrhea, no shortness of breath outside of her bouts of coughing.  I do not think we need to do testing for COVID/influenza given her symptoms.  On cardiovascular and pulmonary exam she is regular rate and rhythm, lungs are "tight" with a few wheezes and crackles that appear equal.  Not indicated for imaging given physical exam.  We will initiate ICS/LABA inhaler as well due to her asthma.  Follow-up with her PCP in 2 to 3 weeks or sooner if needed.  Chest pain: Present for 2 weeks in the setting of running out of albuterol.  It is reproducible on palpation.  She has not  noticed a difference with activity but does have it worse when she has bouts of coughing.  No diaphoresis occurring with it.  She is not able to tell if it is worse with activity as she has significant bouts of coughing with activity such as going up stairs which then makes the chest pain worse.  Doubt cardiovascular due to the fact is reproducible on physical exam.  She is not tachycardic and has not been experiencing dyspnea outside of her bouts of coughing so have less concern for PE.   55, DO [facility]

## 2021-02-28 ENCOUNTER — Other Ambulatory Visit: Payer: Self-pay

## 2021-02-28 ENCOUNTER — Other Ambulatory Visit: Payer: Self-pay | Admitting: Family Medicine

## 2021-02-28 ENCOUNTER — Ambulatory Visit (INDEPENDENT_AMBULATORY_CARE_PROVIDER_SITE_OTHER): Payer: Self-pay | Admitting: Family Medicine

## 2021-02-28 ENCOUNTER — Ambulatory Visit (INDEPENDENT_AMBULATORY_CARE_PROVIDER_SITE_OTHER): Payer: Self-pay

## 2021-02-28 VITALS — BP 120/82 | HR 80 | Ht 63.0 in | Wt 159.5 lb

## 2021-02-28 DIAGNOSIS — Z23 Encounter for immunization: Secondary | ICD-10-CM

## 2021-02-28 DIAGNOSIS — J45909 Unspecified asthma, uncomplicated: Secondary | ICD-10-CM

## 2021-02-28 DIAGNOSIS — R051 Acute cough: Secondary | ICD-10-CM

## 2021-02-28 MED ORDER — FLUTICASONE-SALMETEROL 100-50 MCG/ACT IN AEPB: 1.0000 | INHALATION_SPRAY | Freq: Two times a day (BID) | RESPIRATORY_TRACT | 3 refills | Status: DC

## 2021-02-28 MED ORDER — ALBUTEROL SULFATE HFA 108 (90 BASE) MCG/ACT IN AERS: 1.0000 | INHALATION_SPRAY | Freq: Four times a day (QID) | RESPIRATORY_TRACT | 0 refills | Status: DC | PRN

## 2021-02-28 NOTE — Progress Notes (Deleted)
° ° °  SUBJECTIVE:  ° °CHIEF COMPLAINT / HPI:  ° °Primary symptom:*** °Duration:*** °Severity:*** °Associated symptoms:*** °Fever? Tmax?: *** °Sick contacts:*** °Covid test:*** °Covid vaccination(s):*** ° ° °PERTINENT  PMH / PSH: *** ° °OBJECTIVE:  ° °There were no vitals taken for this visit.  °*** ° °ASSESSMENT/PLAN:  ° °No problem-specific Assessment & Plan notes found for this encounter. °  ° ° °Brandie Lopes, MD °[facility]  ° °

## 2021-02-28 NOTE — Patient Instructions (Addendum)
I refilled your albuterol and starting another inhaler for you to use.  I sent it to your pharmacy.  I think your chest pain is due to the coughing but if you have any worsening chest pain, breaking out in a sweat during chest pain, chest pain that radiates, or chest pain that is not reproducible when you press on her chest and when she to go and be seen.  If you develop any worsening shortness of breath, fevers, or other concerning symptoms please return or seek care immediately.  Otherwise follow-up with your primary doctor in 2 to 4 weeks.

## 2021-04-18 ENCOUNTER — Ambulatory Visit: Payer: Self-pay | Admitting: Family Medicine

## 2021-04-18 ENCOUNTER — Ambulatory Visit (INDEPENDENT_AMBULATORY_CARE_PROVIDER_SITE_OTHER): Payer: BC Managed Care – PPO | Admitting: Family Medicine

## 2021-04-18 DIAGNOSIS — Z3009 Encounter for other general counseling and advice on contraception: Secondary | ICD-10-CM | POA: Diagnosis not present

## 2021-04-18 NOTE — Patient Instructions (Signed)
It was a pleasure to see you today! ? ?You can call and check with [facility] that they should fully cover the cost of your mirena IUD by calling the number on the back of your insurance card ?Please arrive by 3 PM on Friday 05/02/21 for an IUD placement ?I recommend taking ibuprofen 600 mg (3 pills) prior to your appointment for pain control ? ?Be Well, ? ?Dr. Leary Roca ? ??Fue un placer verte hoy! ? ?1. Puede llamar y Investment banker, operational con [employer] Cross Blue [employer] que deben cubrir completamente el costo de su DIU mirena llamando al n?mero que figura en el reverso de su tarjeta de seguro ?2. Llegue antes de las 3 p. m. del viernes 28 de abril de 2023 para la colocaci?n de un DIU ?3. Recomiendo tomar ibuprofeno 600 mg (3 pastillas) antes de su cita para controlar el dolor ? ?[place], ? ?Dra. Matthan Sledge ?

## 2021-04-18 NOTE — Progress Notes (Signed)
? ? ?  SUBJECTIVE:  ? ?CHIEF COMPLAINT / HPI:  ? ?Birth control counseling: patient is interested in birth control. She is still having periods, though they have been irregular for many years. 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 She has previously had IUD's before and liked them, they were removed due to the end of their use. She reports that her mother went into menopause at age 50. She is very interested in IUD or nexplanon for birth control as she has 3 children and is near menopause, but expects due to her mother it may be quite a few years before menopause. She recently got insurance, BCBS, and would prefer mirena to nexplanon. She has never had a DVT, BP is controlled, she does not smoke cigarettes. ? ?PERTINENT  PMH / PSH: non-contributory ? ?OBJECTIVE:  ? ?BP 112/78   Pulse 79   Ht 5\' 3"  (1.6 m)   Wt 160 lb 3.2 oz (72.7 kg)   LMP 04/14/2021   SpO2 100%   BMI 28.38 kg/m?   ?Nursing note and vitals reviewed ?GEN: age-appropriate, LW, resting comfortably in chair, NAD, WNWD ?Neuro: AOx3  ?Ext: no edema ?Psych: Pleasant and appropriate  ? ?ASSESSMENT/PLAN:  ? ?Birth control counseling ?51 yo patient interested in IUD to prevent pregnancy. Counseling provided on options, she elected to have IUD placement, scheduled for 2 weeks.  ?  ? ? ?51, MD ?[facility]   ?

## 2021-04-19 NOTE — Assessment & Plan Note (Signed)
51 yo patient interested in IUD to prevent pregnancy. Counseling provided on options, she elected to have IUD placement, scheduled for 2 weeks.  ?

## 2021-05-02 ENCOUNTER — Encounter: Payer: Self-pay | Admitting: Family Medicine

## 2021-05-02 ENCOUNTER — Ambulatory Visit (INDEPENDENT_AMBULATORY_CARE_PROVIDER_SITE_OTHER): Payer: BC Managed Care – PPO | Admitting: Family Medicine

## 2021-05-02 VITALS — BP 108/72 | HR 74 | Ht 63.0 in | Wt 158.4 lb

## 2021-05-02 DIAGNOSIS — Z30430 Encounter for insertion of intrauterine contraceptive device: Secondary | ICD-10-CM | POA: Diagnosis not present

## 2021-05-02 LAB — POCT URINE PREGNANCY: Preg Test, Ur: NEGATIVE

## 2021-05-02 NOTE — Assessment & Plan Note (Addendum)
Placement of Mirena IUD ? ?Indication: Contraception ?Assisted by: Cleatrice Burke, CMA ? ?Patient was counseled regarding the risks/benefits/alternatives of the IUD. ?Urine pregnancy test negative. ?Consent was obtained and all questions answered.  Time out performed. ? ?Description: ?Cervix was swabbed three times with Betadine swabs. ?Sterile gloves donned. ?Sterile single-tooth tenaculum used to grasp anterior lip of the cervix and straighten the endocervical canal. ?Uterus sounded to 6 cm. ?IUD loaded per manufacturer?s instruction and flange set to 5 cm. ?IUD placed per manufacturer?s directions. Strings trimmed to 3 cm. ?Patient tolerated the procedure well.  Small amount of bleeding that easily stopped on its own. ?Confirmed placement on POCUS with Dr. Miquel Dunn. ? ?Follow-up: ?Patient counseled regarding techniques for self-monitoring of IUD and given precautions. ?Patient notified of removal date (May 03, 2027) and given card. ?Patient will follow-up in 4 weeks for string check. ? ?

## 2021-05-02 NOTE — Progress Notes (Signed)
? ? ?  SUBJECTIVE:  ? ?CHIEF COMPLAINT / HPI:  ? ?IUD: previously discussed IUD placement with patient. She has irregular periods for many years, needs IUD for this and for birth control. Discussed risks of bleeding, infection, uterine perforation. Patient signed consent and wishes to go forward with procedure. Negative urine pregnancy test obtained today prior to procedure. ? ?Spanish interpreter used throughout visit ? ?PERTINENT  PMH / PSH: unwanted fertility ? ?OBJECTIVE:  ? ?BP 108/72   Pulse 74   Ht 5\' 3"  (1.6 m)   Wt 158 lb 6.4 oz (71.8 kg)   LMP 04/14/2021   SpO2 99%   BMI 28.06 kg/m?   ?Nursing note and vitals reviewed ?GEN: age-appropriate latina woman, resting comfortably in chair, NAD, WNWD ?PELVIC:  Normal appearing external female genitalia, normal vaginal epithelium, no abnormal discharge. Normal appearing cervix. ?Neuro: AOx3  ?Ext: no edema ?Psych: Pleasant and appropriate  ? ?ASSESSMENT/PLAN:  ? ?Encounter for insertion of mirena IUD ?Placement of Mirena IUD ? ?Indication: Contraception ?Assisted by: Lavell Anchors, CMA ? ?Patient was counseled regarding the risks/benefits/alternatives of the IUD. ?Urine pregnancy test negative. ?Consent was obtained and all questions answered.  Time out performed. ? ?Description: ?Cervix was swabbed three times with Betadine swabs. ?Sterile gloves donned. ?Sterile single-tooth tenaculum used to grasp anterior lip of the cervix and straighten the endocervical canal. ?Uterus sounded to 6 cm. ?IUD loaded per manufacturer?s instruction and flange set to 5 cm. ?IUD placed per manufacturer?s directions. Strings trimmed to 3 cm. ?Patient tolerated the procedure well.  Small amount of bleeding that easily stopped on its own. ?Confirmed placement on POCUS with Dr. Thompson Grayer. ? ?Follow-up: ?Patient counseled regarding techniques for self-monitoring of IUD and given precautions. ?Patient notified of removal date (May 03, 2027) and given card. ?Patient will follow-up in 4  weeks for string check. ?  ? ? ?Gladys Damme, MD ?[place]  ? ?

## 2021-05-02 NOTE — Patient Instructions (Signed)
IUD AFTERCARE ? ?1.  Uterine cramping is common after IUD placement.  You  May using heating pads, ibuprofen, and tylenol.  If it becomes very painful, you notice fever or chills, unusual bleeding, or foul smelling vaginal discharge please call the office. ?2.  Irregular vaginal bleeding is common in the first few months after IUD placement but will often get better in the first six months.   ?3.  IUDs do not protect against STD such as HIV, genital warts, gonorrhea, chlamydia, herpes.  Please continue to protect yourself against these infections and contact the office if you think you may have contracted an infection. ?4.  Checking for proper placement:  You may feel for your IUD strings as shown today by placing you fingers into your vagina.  Do not pull on the strings.  If your Mirena falls out, you can feel the hard plastic part of the IUD, or you can no longer feel the strings, please contact the office. ?5.  Your Mirena should be removed or replaced in 6 years. ?6.  Please see package insert or [email or web address] for full patient information. ?7.  Make follow-up appointment for 4 weeks. ? ? ?CUIDADO POSTERIOR DEL DIU ? ?1. Los c?licos uterinos son comunes despu?s de la colocaci?n del DIU. Puede usar almohadillas t?rmicas, ibuprofeno y tylenol. Si se vuelve muy doloroso, nota fiebre o escalofr?os, sangrado inusual o flujo vaginal maloliente, llame a la oficina. ?2. El sangrado vaginal irregular es com?n en los primeros meses despu?s de la colocaci?n del DIU, pero a menudo mejorar? en los primeros seis meses. ?3. Los DIU no protegen contra las ETS como el VIH, las verrugas genitales, la gonorrea, la clamidia y el herpes. Contin?e protegi?ndose contra estas infecciones y comun?quese con la oficina si cree que puede haber contra?do una infecci?n. ?4. Comprobaci?n de la colocaci?n adecuada: puede palpar los hilos del DIU como se muestra hoy colocando los dedos en la vagina. No tire de los hilos. Si su Mirena se cae,  puede sentir la parte de pl?stico duro del DIU o ya no puede sentir los hilos, comun?quese con la oficina. ?5. Su Mirena debe ser removido o reemplazado en 6 a?os. ?6. Consulte el prospecto o visite [email or web address] para obtener informaci?n completa para el paciente. ?7. Haga una cita de seguimiento durante 4 semanas. ?

## 2021-05-09 MED ORDER — LEVONORGESTREL 20 MCG/DAY IU IUD
1.0000 | INTRAUTERINE_SYSTEM | Freq: Once | INTRAUTERINE | Status: AC
  Administered 2021-05-02: 1 via INTRAUTERINE

## 2021-05-09 NOTE — Addendum Note (Signed)
Addended by: Henri Medal on: 05/09/2021 11:39 AM ? ? Modules accepted: Orders ? ?

## 2021-05-23 ENCOUNTER — Ambulatory Visit: Payer: BC Managed Care – PPO | Admitting: Family Medicine

## 2021-05-23 NOTE — Progress Notes (Deleted)
    SUBJECTIVE:   CHIEF COMPLAINT / HPI:   IUD check: Patient had mirena placed by me on 05/02/21. Since then she reports ***. She is here today to check the strings.   PERTINENT  PMH / PSH: ***  OBJECTIVE:   There were no vitals taken for this visit.  ***  ASSESSMENT/PLAN:   No problem-specific Assessment & Plan notes found for this encounter.     Shirlean Mylar, MD [facility]

## 2021-07-21 ENCOUNTER — Ambulatory Visit: Payer: BC Managed Care – PPO | Admitting: Family Medicine

## 2021-07-22 ENCOUNTER — Emergency Department (HOSPITAL_COMMUNITY): Payer: BC Managed Care – PPO

## 2021-07-22 ENCOUNTER — Encounter (HOSPITAL_COMMUNITY): Payer: Self-pay | Admitting: Pharmacy Technician

## 2021-07-22 ENCOUNTER — Emergency Department (HOSPITAL_COMMUNITY)
Admission: EM | Admit: 2021-07-22 | Discharge: 2021-07-23 | Disposition: A | Discharge status: Home / Self Care | Payer: BC Managed Care – PPO | Attending: Student | Admitting: Student

## 2021-07-22 DIAGNOSIS — R300 Dysuria: Secondary | ICD-10-CM | POA: Diagnosis not present

## 2021-07-22 DIAGNOSIS — N390 Urinary tract infection, site not specified: Secondary | ICD-10-CM

## 2021-07-22 DIAGNOSIS — Z7951 Long term (current) use of inhaled steroids: Secondary | ICD-10-CM | POA: Insufficient documentation

## 2021-07-22 DIAGNOSIS — R102 Pelvic and perineal pain: Secondary | ICD-10-CM | POA: Diagnosis present

## 2021-07-22 DIAGNOSIS — J45909 Unspecified asthma, uncomplicated: Secondary | ICD-10-CM | POA: Insufficient documentation

## 2021-07-22 LAB — CBC
HCT: 37.7 % (ref 36.0–46.0)
Hemoglobin: 12.6 g/dL (ref 12.0–15.0)
MCH: 29.3 pg (ref 26.0–34.0)
MCHC: 33.4 g/dL (ref 30.0–36.0)
MCV: 87.7 fL (ref 80.0–100.0)
Platelets: 193 10*3/uL (ref 150–400)
RBC: 4.3 MIL/uL (ref 3.87–5.11)
RDW: 13.1 % (ref 11.5–15.5)
WBC: 6.6 10*3/uL (ref 4.0–10.5)
nRBC: 0 % (ref 0.0–0.2)

## 2021-07-22 LAB — COMPREHENSIVE METABOLIC PANEL
ALT: 18 U/L (ref 0–44)
AST: 18 U/L (ref 15–41)
Albumin: 4.1 g/dL (ref 3.5–5.0)
Alkaline Phosphatase: 61 U/L (ref 38–126)
Anion gap: 9 (ref 5–15)
BUN: 14 mg/dL (ref 6–20)
CO2: 23 mmol/L (ref 22–32)
Calcium: 9.3 mg/dL (ref 8.9–10.3)
Chloride: 106 mmol/L (ref 98–111)
Creatinine, Ser: 0.6 mg/dL (ref 0.44–1.00)
GFR, Estimated: >60 mL/min (ref >60)
Glucose, Bld: 97 mg/dL (ref 70–99)
Potassium: 3.7 mmol/L (ref 3.5–5.1)
Sodium: 138 mmol/L (ref 135–145)
Total Bilirubin: 0.6 mg/dL (ref 0.3–1.2)
Total Protein: 7.4 g/dL (ref 6.5–8.1)

## 2021-07-22 LAB — LIPASE, BLOOD: Lipase: 37 U/L (ref 11–51)

## 2021-07-22 LAB — I-STAT BETA HCG BLOOD, ED (MC, WL, AP ONLY): I-stat hCG, quantitative: <5 m[IU]/mL (ref <5)

## 2021-07-22 NOTE — ED Provider Triage Note (Signed)
Emergency Medicine Provider Triage Evaluation Note  Tracey Kane , a 51 y.o. female  was evaluated in triage.  Pt complains of low pelvic pain. States that same has been ongoing for the past 2 weeks. Went to her PCP and states they did a pelvic exam and send her here for evaluation because they could not find her IUD strings. She denies nausea, vomiting, or diarrhea. States that she has had some spotting when she urinates. Last menstrual cycle was June 15. States that the pain is in her low pelvis and into her vagina. No history of previous  Review of Systems  Positive:  Negative:   Physical Exam  BP (!) 148/86 (BP Location: Right Arm)   Pulse 77   Temp 97.9 F (36.6 C) (Oral)   Resp 16   SpO2 95%  Gen:   Awake, no distress   Resp:  Normal effort  MSK:   Moves extremities without difficulty  Other:    Medical Decision Making  Medically screening exam initiated at 7:07 PM.  Appropriate orders placed.  Leyda Susana Giada Schoppe was informed that the remainder of the evaluation will be completed by another provider, this initial triage assessment does not replace that evaluation, and the importance of remaining in the ED until their evaluation is complete.     Vear Clock 07/22/21 1910

## 2021-07-22 NOTE — ED Triage Notes (Signed)
Via Interpreter:  Pt here from Dr. Isidore Moos with ongoing lower abdominal pain. Pt also states she was checked out there and was told to come here for removal of IUD as they were unable to locate the IUD.

## 2021-07-23 ENCOUNTER — Other Ambulatory Visit: Payer: Self-pay

## 2021-07-23 LAB — URINALYSIS, ROUTINE W REFLEX MICROSCOPIC
Bilirubin Urine: NEGATIVE
Glucose, UA: NEGATIVE mg/dL
Ketones, ur: 80 mg/dL — AB
Nitrite: NEGATIVE
Protein, ur: NEGATIVE mg/dL
Specific Gravity, Urine: 1.017 (ref 1.005–1.030)
WBC, UA: >50 WBC/hpf — ABNORMAL HIGH (ref 0–5)
pH: 5 (ref 5.0–8.0)

## 2021-07-23 LAB — WET PREP, GENITAL
Clue Cells Wet Prep HPF POC: NONE SEEN
Sperm: NONE SEEN
Trich, Wet Prep: NONE SEEN
WBC, Wet Prep HPF POC: <10 (ref <10)
Yeast Wet Prep HPF POC: NONE SEEN

## 2021-07-23 MED ORDER — CEFADROXIL 500 MG PO CAPS
500.0000 mg | ORAL_CAPSULE | Freq: Two times a day (BID) | ORAL | Status: DC
  Administered 2021-07-23: 500 mg via ORAL
  Filled 2021-07-23 (×2): qty 1

## 2021-07-23 MED ORDER — CEFADROXIL 500 MG PO CAPS: 500.0000 mg | ORAL_CAPSULE | Freq: Two times a day (BID) | ORAL | 0 refills | Status: AC

## 2021-07-23 NOTE — ED Provider Notes (Signed)
[facility] EMERGENCY DEPARTMENT Provider Note  CSN: [id number] Arrival date & time: 07/22/21 1837  Chief Complaint(s) Abdominal Pain  HPI Smita Angelena Sole Cynethia Schindler is a 51 y.o. female with PMH abdominal plasty, asthma who presents emergency department for evaluation of abdominal pain and vaginal pelvic pain.  Patient states that she was seen at the health department earlier who was concerned about her IUD moving and sent her to the emergency department for evaluation.  Patient states that when she walks she has crampy pelvic and right lower quadrant abdominal pain.  She states that she has some mild bleeding that has been continuous since her last period 1 month ago.  She also states that she has had significant urge incontinence where she feels that she needs to rush to the bathroom or she will involuntarily urinate on herself.  She states that she does have dysuria but this only occurs after completion of the urinary stream.  She states that she has not been sexually active for 2 months and has been using a vinegar-based douche for the last 3 months.   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 Authorizing Provider  albuterol (VENTOLIN HFA) 108 (90 Base) MCG/ACT inhaler INHALE 1 TO 2 PUFFS INTO THE LUNGS EVERY 6 HOURS AS NEEDED FOR WHEEZING OR SHORTNESS OF BREATH 02/28/21   Littie Deeds, MD  cetirizine (ZYRTEC ALLERGY) 10 MG tablet Take 1 tablet (10 mg total) by mouth  daily. Patient not taking: Reported on 12/25/2020 01/01/20   Wallis Bamberg, PA-C  fluticasone-salmeterol (ADVAIR) 100-50 MCG/ACT AEPB Inhale 1 puff into the lungs 2 (two) times daily. 02/28/21   Jackelyn Poling, DO  norethindrone (ORTHO MICRONOR) 0.35 MG tablet Take 1 tablet (0.35 mg total) by mouth daily. 02/14/21   Towanda Octave, MD  omeprazole (PRILOSEC) 20 MG capsule Take 20 mg by mouth daily.    [provider]  predniSONE (STERAPRED UNI-PAK 21 TAB) 10 MG (21) TBPK tablet Take by mouth daily. Take 6 tabs by mouth daily  for 2 days, then 5 tabs for 2 days, then 4 tabs for 2 days, then 3 tabs for 2 days, 2 tabs for 2 days, then 1 tab by mouth daily for 2 days Patient not taking: Reported on 09/18/2020 06/12/20   Rhys Martini, PA-C  Turmeric (QC TUMERIC COMPLEX PO) Take by mouth.    [provider]  fluticasone (FLONASE) 50 MCG/ACT nasal spray Place 2 sprays into both nostrils daily. 07/28/19 01/01/20  Belinda Fisher, PA-C  loratadine (CLARITIN) 10 MG tablet Take 10 mg by mouth daily as needed for allergies. Please print label in spanish   01/01/20  [provider]  Past Surgical History Past Surgical History:  Procedure Laterality Date   knee sx     LAPAROSCOPIC APPENDECTOMY N/A 04/02/2016   Procedure: APPENDECTOMY LAPAROSCOPIC;  Surgeon: Violeta Gelinas, MD;  Location: [facility] OR;  Service: General;  Laterality: N/A;   Family History Family History  Problem Relation Age of Onset   Heart disease Father    Hyperlipidemia Father    Hypertension Father    Diabetes Father     Social History Social History   Tobacco Use   Smoking status: Never   Smokeless tobacco: Never  Vaping Use   Vaping Use: Never used  Substance Use Topics   Alcohol use: No   Drug use: No   Allergies Azithromycin and Norgestimate-eth estradiol  Review of Systems Review  of Systems  Gastrointestinal:  Positive for abdominal pain.  Genitourinary:  Positive for dysuria and vaginal pain.    Physical Exam Vital Signs  I have reviewed the triage vital signs BP 130/76   Pulse 80   Temp 97.7 F (36.5 C) (Oral)   Resp 18   SpO2 98%   Physical Exam Vitals and nursing note reviewed.  Constitutional:      General: She is not in acute distress.    Appearance: She is well-developed.  HENT:     Head: Normocephalic and atraumatic.  Eyes:     Conjunctiva/sclera: Conjunctivae normal.  Cardiovascular:     Rate and Rhythm: Normal rate and regular rhythm.     Heart sounds: No murmur heard. Pulmonary:     Effort: Pulmonary effort is normal. No respiratory distress.     Breath sounds: Normal breath sounds.  Abdominal:     Palpations: Abdomen is soft.     Tenderness: There is abdominal tenderness in the suprapubic area.  Genitourinary:    Cervix: Normal.  Musculoskeletal:        General: No swelling.     Cervical back: Neck supple.  Skin:    General: Skin is warm and dry.     Capillary Refill: Capillary refill takes less than 2 seconds.  Neurological:     Mental Status: She is alert.  Psychiatric:        Mood and Affect: Mood normal.     ED Results and Treatments Labs (all labs ordered are listed, but only abnormal results are displayed) Labs Reviewed  WET PREP, GENITAL  LIPASE, BLOOD  COMPREHENSIVE METABOLIC PANEL  CBC  URINALYSIS, ROUTINE W REFLEX MICROSCOPIC  I-STAT BETA HCG BLOOD, ED ([facility], [facility], [facility] ONLY)  GC/CHLAMYDIA PROBE AMP ([place]) NOT AT [facility]                                                                                                                          Radiology US PELVIC COMPLETE WITH TRANSVAGINAL  Result Date: 07/22/2021 CLINICAL DATA:  Missing IUD vaginal bleeding EXAM: TRANSABDOMINAL AND TRANSVAGINAL ULTRASOUND OF PELVIS TECHNIQUE: Both transabdominal and transvaginal ultrasound examinations of the pelvis were  performed. Transabdominal technique  was performed for global imaging of the pelvis including uterus, ovaries, adnexal regions, and pelvic cul-de-sac. It was necessary to proceed with endovaginal exam following the transabdominal exam to visualize the uterus endometrium ovaries. COMPARISON:  Ultrasound 04/02/2016 FINDINGS: Uterus Measurements: 6.8 x 3 x 3.9 cm = volume: 42 mL. No fibroids or other mass visualized. Endometrium Thickness: 2 mm.  IUD in the upper uterine segment Right ovary Measurements: 2 x 1 x 1 cm = volume: 1.1 mL. Normal appearance/no adnexal mass. Left ovary Measurements: 2 x 1.2 x 1.2 cm = volume: 1.3 mL. Normal appearance/no adnexal mass. Other findings No abnormal free fluid. IMPRESSION: 1. IUD appears grossly appropriate in position by sonography. 2. Otherwise negative pelvic ultrasound Electronically Signed   By: Jasmine Pang M.D.   On: 07/22/2021 20:44    Pertinent labs & imaging results that were available during my care of the patient were reviewed by me and considered in my medical decision making (see MDM for details).  Medications Ordered in ED Medications - No data to display                                                                                                                                   Procedures Procedures  (including critical care time)  Medical Decision Making / ED Course   This patient presents to the ED for concern of pelvic pain, dysuria, this involves an extensive number of treatment options, and is a complaint that carries with it a high risk of complications and morbidity.  The differential diagnosis includes UTI, pelvic floor weakness secondary to abdominoplasty, STI, dislodged IUD, ovarian cyst, intra-abdominal infection, PID  MDM: Patient seen emergency room for evaluation of multiple complaints described above.  Physical exam with mild suprapubic tenderness to palpation and on pelvic exam, minimal amount of white discharge that was wet  prep negative as well as global adnexal and cervical tenderness on bimanual exam when pushing up against the bladder.  Transvaginal ultrasound with a follicle but no evidence of torsion and IUD appears to be in place.  Laboratory evaluation unremarkable.  Urinalysis concerning for infection.  Patient started on Duricef for her UTI and urine sent for culture.  Patient presentation appears consistent with a urinary tract infection today and given the patient's previous concern for urge incontinence, she will follow-up with OB/GYN to assess the integrity of the pelvic floor as I am concerned that this may be contributing to her urinary tract infections and get in the way of appropriate complete bladder emptying.  Patient then discharged with OB/GYN follow-up.   Additional history obtained: -Additional history obtained from husband -External records from outside source obtained and reviewed including: Chart review including previous notes, labs, imaging, consultation notes   Lab Tests: -I ordered, reviewed, and interpreted labs.   The pertinent results include:   Labs Reviewed  WET PREP, GENITAL  LIPASE, BLOOD  COMPREHENSIVE METABOLIC PANEL  CBC  URINALYSIS, ROUTINE W REFLEX MICROSCOPIC  I-STAT BETA HCG BLOOD, ED ([facility], [facility], [facility] ONLY)  GC/CHLAMYDIA PROBE AMP ([place]) NOT AT [facility]         Imaging Studies ordered: I ordered imaging studies including TVUS I independently visualized and interpreted imaging. I agree with the radiologist interpretation   Medicines ordered and prescription drug management: No orders of the defined types were placed in this encounter.   -I have reviewed the patients home medicines and have made adjustments as needed  Critical interventions none   Social Determinants of Health:  Factors impacting patients care include: English second language   Reevaluation: After the interventions noted above, I reevaluated the patient and found that they have  :improved  Co morbidities that complicate the patient evaluation  Past Medical History:  Diagnosis Date   Asthma       Dispostion: I considered admission for this patient, but she does not meet inpatient criteria for admission and urinary tract infection will be treated, she is safe for discharge with outpatient OB/GYN follow-up.     Final Clinical Impression(s) / ED Diagnoses Final diagnoses:  None     @PCDICTATION @    , MD 07/23/21 2146

## 2021-07-24 LAB — GC/CHLAMYDIA PROBE AMP (~~LOC~~) NOT AT ARMC
Chlamydia: NEGATIVE
Comment: NEGATIVE
Comment: NORMAL
Neisseria Gonorrhea: NEGATIVE

## 2021-07-30 NOTE — Progress Notes (Signed)
    SUBJECTIVE:   CHIEF COMPLAINT / HPI: Cannot hold urine  History provided by patient and daughter.  Patient preferred daughter as Nurse, learning disability over official.  Issues urinating Patient reports progressively worsening urinary frequency and incontinence with coughing and exercise.  Reports having to pee 7-8 times per day. Patient reports having cosmetic abdominal surgery last year. Reports feeling like 5-6 months ago that "something popped."  Reports that urine was tinged with blood for around 2 months up until the past week.  Patient went to ED 1 week ago and was diagnosed with urinary tract infection and treated with cefadroxil.  At that time patient had additional symptoms of burning and discomfort with urination but those have resolved.  She states there is no longer blood in her urine. Patient reports having cosmetic abdominal surgery last year. Reports feeling like 5-6 months ago that "something popped." Antibiotics helped use her pain.  Patient believes urine is in urine versus originating from vagina.  Patient previously had regular cycles before getting Mirena IUD in late April.  Last regular cycle started June 15.  Unsure of end due to concomitant bleeding with urination.  Denies increased fatigue, shortness of breath on exertion, dizziness, chest pain.  Vaginal discomfort Patient reports her vagina feels like "Feels like something is there". Patient provided photo.  Describes a nodule at the posterior part of her vagina that has been there for couple months.  Patient endorses hot flashes, some chills and night.  Denies it being red or inflamed.  Denies fevers.     PERTINENT  PMH / PSH: Appendectomy  OBJECTIVE:   BP 110/68   Pulse 68   Ht 5\' 3"  (1.6 m)   Wt 162 lb (73.5 kg)   LMP 06/19/2021   SpO2 100%   BMI 28.70 kg/m    General: In no acute distress, talking comfortably in clinic room Respiratory: Normal work of breathing on room air GU: Deferred in setting of patient  showing picture  Lab:  Chlamydia and gonorrhea testing negative in emergency room on 7/19  UA from ED positive for leukocytes, and bacteria.  No red blood cells seen in urine on 7/19.  ASSESSMENT/PLAN:   Stress incontinence Patient describes symptoms consistent with stress incontinence.  Frequent urination that she cannot control with exercise and/or stress.  - Referral to urogynecology  Possible bartholin cyst Photo patient provided demonstrated roughly 1 cm nonerythematous lateralized bulge in posterior vagina.  Patient reports having been there for couple months.  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 -Referral to urogynecology   Urinary tract infection-resolved Patient previously had symptoms for taking antibiotic course prescribed by ED provider.  Burning and pain on urination resolved.  Course finished.  8/19, MD [facility]

## 2021-08-01 ENCOUNTER — Ambulatory Visit (INDEPENDENT_AMBULATORY_CARE_PROVIDER_SITE_OTHER): Payer: BC Managed Care – PPO | Admitting: Family Medicine

## 2021-08-01 ENCOUNTER — Encounter: Payer: Self-pay | Admitting: Family Medicine

## 2021-08-01 DIAGNOSIS — N750 Cyst of Bartholin's gland: Secondary | ICD-10-CM | POA: Diagnosis not present

## 2021-08-01 DIAGNOSIS — N393 Stress incontinence (female) (male): Secondary | ICD-10-CM | POA: Diagnosis not present

## 2021-08-01 NOTE — Assessment & Plan Note (Signed)
Patient describes symptoms consistent with stress incontinence.  Frequent urination that she cannot control with exercise and/or stress.  - Referral to urogynecology

## 2021-08-01 NOTE — Patient Instructions (Addendum)
Estuvo muy bien verte! Gracias por permitirme participar en su cuidado!  Le recomiendo que siempre traiga sus medicamentos a cada cita, ya que esto hace que sea ms fcil asegurarnos de que estamos tomando los medicamentos correctos y nos ayuda a no perder cuando se Personal assistant.  Nuestros planes para hoy: - He hecho una derivacin a Uro-ginecologa para evaluacin de incontinencia de esfuerzo. Llamarn para concertar una cita. - Los ejercicios que puede hacer en casa se enumeran a continuacin. - Hablamos de su UTI y creo que se tratar con los antibiticos. - Si tiene ms sangrado o tiene dificultad para Industrial/product designer, regrese a la clnica o vaya al departamento de emergencias.   Cudese y busque atencin inmediata antes si tiene alguna inquietud.  Dr. Celine Mans, MD [facility]  It was great to see you! Thank you for allowing me to participate in your care!  I recommend that you always bring your medications to each appointment as this makes it easy to ensure we are on the correct medications and helps [place] not miss when refills are needed.  Our plans for today:  - I have made a referral to Uro-gynecology for evaluation of stress incontinence. They will call to set up an appointment. - Exercises you can do at home are listed below. - We discussed your UTI and I believe it to be treated with the antibiotics. - If you have more bleeding or have difficulty breathing please come back to clinic or go to emergency department.    Take care and seek immediate care sooner if you develop any concerns.   Dr. Celine Mans, MD [facility]   Ejercicios de Kegel Kegel Exercises  Los ejercicios de Kegel pueden ayudar a fortalecer los msculos del suelo plvico. El suelo plvico est formado por un grupo de msculos que sostienen el recto, el intestino delgado y la vejiga. En las mujeres, los msculos del suelo plvico tambin sostienen el tero. Estos msculos  ayudan a controlar el flujo de [place] y materia fecal (heces). Los ejercicios de Kegel son simples e indoloros. No requieren ningn equipo. El mdico puede sugerir ejercicios de Kegel para: Mejorar el control de la vejiga y los intestinos. Mejorar la respuesta sexual. Mejorar los [employer] dbiles del suelo plvico despus de una ciruga para extirpar el tero (histerectoma) o de un embarazo, en las mujeres. Mejorar los msculos del suelo plvico dbiles despus de la extirpacin o ciruga de la prstata, en los hombres. Los ejercicios de Advertising copywriter los msculos del suelo plvico. Estos son los mismos msculos que se aprietan cuando se intenta detener el flujo de orina o evitar expulsar gases. Estos ejercicios se pueden Ecologist est sentado, parado o [place], pero lo mejor es variar la posicin. Pregunte al mdico qu ejercicios son seguros para usted. Haga los ejercicios exactamente como se lo haya indicado el mdico y gradelos como se lo hayan indicado. No comience a hacer estos ejercicios hasta que se lo indique el mdico. Ejercicios Cmo hacer los ejercicios de Kegel: Contraiga con fuerza los msculos del suelo plvico. Debe sentir que el rea rectal se eleva y se tensa. Si es [place], tambin debe sentir tensin en el rea vaginal. Mantenga el [place], las nalgas y las piernas [place]. Mantenga los msculos tensos durante 10 segundos como mximo. Respire con normalidad. Relaje los msculos durante un mximo de 10 segundos. Repita como se lo haya indicado el mdico. Repita este ejercicio a diario como se lo haya indicado el  mdico. Contine haciendo este ejercicio durante al menos 4 a 6 semanas o el tiempo que le haya indicado su mdico. Pueden derivarlo a un fisioterapeuta que lo puede ayudar a aprender ms sobre cmo hacer los ejercicios de Kegel. Segn sea su estado, el mdico puede recomendarle lo siguiente: Variar cunto [employer]. Hacer varias  series de ejercicios [employer]. Hacer ejercicios durante varias semanas. Convertir los ejercicios de Kegel en parte de su rutina de ejercicios habitual. Esta informacin no tiene Theme park manager el consejo del mdico. Asegrese de hacerle al mdico cualquier pregunta que tenga. Document Revised: 05/23/2020 Document Reviewed: 05/23/2020 Elsevier Patient Education  2023 [employer].

## 2021-08-01 NOTE — Assessment & Plan Note (Addendum)
Photo patient provided demonstrated roughly 1 cm nonerythematous lateralized bulge in posterior vagina.  Patient reports having been there for couple months.  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 -Referral to urogynecology

## 2021-09-11 ENCOUNTER — Other Ambulatory Visit: Payer: Self-pay

## 2021-09-11 MED ORDER — ALBUTEROL SULFATE HFA 108 (90 BASE) MCG/ACT IN AERS
INHALATION_SPRAY | RESPIRATORY_TRACT | 0 refills | Status: DC
Start: 2021-09-11 — End: 2023-03-23

## 2021-09-11 MED ORDER — FLUTICASONE-SALMETEROL 100-50 MCG/ACT IN AEPB: 1.0000 | INHALATION_SPRAY | Freq: Two times a day (BID) | RESPIRATORY_TRACT | 3 refills | Status: DC

## 2022-03-19 ENCOUNTER — Ambulatory Visit (INDEPENDENT_AMBULATORY_CARE_PROVIDER_SITE_OTHER): Payer: BC Managed Care – PPO | Admitting: Family Medicine

## 2022-03-19 VITALS — BP 118/80 | HR 74 | Ht 63.0 in | Wt 155.4 lb

## 2022-03-19 DIAGNOSIS — Z1211 Encounter for screening for malignant neoplasm of colon: Secondary | ICD-10-CM

## 2022-03-19 DIAGNOSIS — R1030 Lower abdominal pain, unspecified: Secondary | ICD-10-CM | POA: Diagnosis not present

## 2022-03-19 MED ORDER — POLYETHYLENE GLYCOL 3350 17 GM/SCOOP PO POWD: 17.0000 g | Freq: Every day | ORAL | 0 refills | Status: AC

## 2022-03-19 NOTE — Progress Notes (Signed)
    SUBJECTIVE:   CHIEF COMPLAINT / HPI:  Chief Complaint  Patient presents with   Abdominal Pain   Chills   Fatigue    Spanish video interpretor present  Intermittent lower abdominal pain x 2 weeks, gradually worsening. Has been waking her up at night at times. Sometimes pain is worse 15-20 minutes after eating. Having some chills and fatigue. Reports she was constipated prior to pain starting but this resolved. Feels abdominal bloating as well after eating. Denies fever, nausea, vomiting, diarrhea, vaginal discharge, urinary symptoms. Is having loose stools however. Taking omeprazole daily.  Reports she does have a history of constipation which is usually improved with omeprazole.  PERTINENT  PMH / PSH: GERD, appendicitis s/p appendectomy 2018  Patient Care Team: Shary Key, DO as PCP - General (Family Medicine) Clent Demark, PA-C (Physician Assistant)   OBJECTIVE:   BP 118/80   Pulse 74   Ht 5\' 3"  (1.6 m)   Wt 155 lb 6.4 oz (70.5 kg)   LMP 02/01/2022   SpO2 100%   BMI 27.53 kg/m   Physical Exam Constitutional:      General: She is not in acute distress. HENT:     Head: Normocephalic and atraumatic.  Cardiovascular:     Rate and Rhythm: Normal rate and regular rhythm.  Pulmonary:     Effort: Pulmonary effort is normal. No respiratory distress.     Breath sounds: Normal breath sounds.  Abdominal:     General: Bowel sounds are normal.     Palpations: Abdomen is soft.     Tenderness: There is abdominal tenderness.     Comments: Mild diffuse lower abdominal tenderness.  Negative Murphy sign.  No hepatosplenomegaly.  Musculoskeletal:     Cervical back: Neck supple.  Neurological:     Mental Status: She is alert.         03/19/2022    3:10 PM  Depression screen PHQ 2/9  Decreased Interest 1  Down, Depressed, Hopeless 1  PHQ - 2 Score 2  Altered sleeping 1  Tired, decreased energy 1  Change in appetite 1  Feeling bad or failure about  yourself  0  Trouble concentrating 0  Moving slowly or fidgety/restless 0  Suicidal thoughts 0  PHQ-9 Score 5  Difficult doing work/chores Not difficult at all     {Show previous vital signs (optional):23777}    ASSESSMENT/PLAN:   1. Lower abdominal pain Intermittent postprandial lower abdominal pain ongoing for 2 weeks.  Weight stable, no urinary or vaginal symptoms.  Etiology is unclear and pain is somewhat vague.  Abdominal exam is overall benign, does have some mild nonfocal tenderness.  Could be constipation given history and did have some constipation prior to symptom onset, will trial stool softener.  Will check labs to assess for anemia, inflammation, pancreatitis, electrolyte derangement, hepatitis.  If worsening, could consider further workup with imaging. - CBC with Differential - Comprehensive metabolic panel - Lipase - trial Miralax  2. Screen for colon cancer - Ambulatory referral to Gastroenterology (screening colonoscopy)    Return if symptoms worsen or fail to improve.   Zola Button, MD [place]

## 2022-03-19 NOTE — Patient Instructions (Addendum)
It was nice seeing you today!  We are checking some labs today.  It is possible that you may be having some constipation.  I recommend that you try taking a stool softener called MiraLAX once a day for 1 week to see if this helps with your symptoms.  I am referring you to the gastroenterologist for colon cancer screening. -- Estamos revisando algunos laboratorios hoy.  Es posible que ests teniendo algo de estreimiento. Le recomiendo que pruebe a tomar un ablandador de heces llamado MiraLAX una vez al da durante 1 semana para ver si esto ayuda con sus sntomas.  Lo remito al gastroenterlogo para la deteccin del cncer de colon.  Urology - [place] [phone], [place] [address] [phone])  Stay well, Zola Button, MD [place] [phone]  --  Make sure to check out at the front desk before you leave today.  Please arrive at least 15 minutes prior to your scheduled appointments.  If you had blood work today, I will send you a MyChart message or a letter if results are normal. Otherwise, I will give you a call.  If you had a referral placed, they will call you to set up an appointment. Please give [place] a call if you don't hear back in the next 2 weeks.  If you need additional refills before your next appointment, please call your pharmacy first.

## 2022-03-20 ENCOUNTER — Encounter: Payer: Self-pay | Admitting: Family Medicine

## 2022-03-20 LAB — COMPREHENSIVE METABOLIC PANEL
ALT: 20 IU/L (ref 0–32)
AST: 17 IU/L (ref 0–40)
Albumin/Globulin Ratio: 1.7 (ref 1.2–2.2)
Albumin: 4.4 g/dL (ref 3.9–4.9)
Alkaline Phosphatase: 70 IU/L (ref 44–121)
BUN/Creatinine Ratio: 19 (ref 9–23)
BUN: 15 mg/dL (ref 6–24)
Bilirubin Total: <0.2 mg/dL (ref 0.0–1.2)
CO2: 24 mmol/L (ref 20–29)
Calcium: 9.4 mg/dL (ref 8.7–10.2)
Chloride: 104 mmol/L (ref 96–106)
Creatinine, Ser: 0.78 mg/dL (ref 0.57–1.00)
Globulin, Total: 2.6 g/dL (ref 1.5–4.5)
Glucose: 125 mg/dL — ABNORMAL HIGH (ref 70–99)
Potassium: 4.4 mmol/L (ref 3.5–5.2)
Sodium: 142 mmol/L (ref 134–144)
Total Protein: 7 g/dL (ref 6.0–8.5)
eGFR: 92 mL/min/{1.73_m2} (ref >59)

## 2022-03-20 LAB — CBC WITH DIFFERENTIAL/PLATELET
Basophils Absolute: 0 10*3/uL (ref 0.0–0.2)
Basos: 1 %
EOS (ABSOLUTE): 0.2 10*3/uL (ref 0.0–0.4)
Eos: 3 %
Hematocrit: 35.3 % (ref 34.0–46.6)
Hemoglobin: 11.9 g/dL (ref 11.1–15.9)
Immature Grans (Abs): 0 10*3/uL (ref 0.0–0.1)
Immature Granulocytes: 0 %
Lymphocytes Absolute: 2.6 10*3/uL (ref 0.7–3.1)
Lymphs: 40 %
MCH: 30.7 pg (ref 26.6–33.0)
MCHC: 33.7 g/dL (ref 31.5–35.7)
MCV: 91 fL (ref 79–97)
Monocytes Absolute: 0.4 10*3/uL (ref 0.1–0.9)
Monocytes: 6 %
Neutrophils Absolute: 3.3 10*3/uL (ref 1.4–7.0)
Neutrophils: 50 %
Platelets: 198 10*3/uL (ref 150–450)
RBC: 3.88 x10E6/uL (ref 3.77–5.28)
RDW: 12.8 % (ref 11.7–15.4)
WBC: 6.5 10*3/uL (ref 3.4–10.8)

## 2022-03-20 LAB — LIPASE: Lipase: 46 U/L (ref 14–72)

## 2022-05-13 ENCOUNTER — Ambulatory Visit (HOSPITAL_COMMUNITY)
Admission: RE | Admit: 2022-05-13 | Discharge: 2022-05-13 | Disposition: A | Discharge status: Home / Self Care | Payer: BC Managed Care – PPO | Source: Ambulatory Visit | Attending: Family Medicine | Admitting: Family Medicine

## 2022-05-13 ENCOUNTER — Ambulatory Visit (INDEPENDENT_AMBULATORY_CARE_PROVIDER_SITE_OTHER): Payer: BC Managed Care – PPO | Admitting: Student

## 2022-05-13 ENCOUNTER — Other Ambulatory Visit: Payer: Self-pay

## 2022-05-13 ENCOUNTER — Encounter: Payer: Self-pay | Admitting: Student

## 2022-05-13 ENCOUNTER — Emergency Department (HOSPITAL_COMMUNITY): Payer: BC Managed Care – PPO

## 2022-05-13 ENCOUNTER — Emergency Department (HOSPITAL_COMMUNITY)
Admission: EM | Admit: 2022-05-13 | Discharge: 2022-05-13 | Disposition: A | Discharge status: Home / Self Care | Payer: BC Managed Care – PPO | Attending: Emergency Medicine | Admitting: Emergency Medicine

## 2022-05-13 ENCOUNTER — Encounter (HOSPITAL_COMMUNITY): Payer: Self-pay

## 2022-05-13 VITALS — BP 137/80 | HR 76 | Ht 64.0 in | Wt 154.2 lb

## 2022-05-13 DIAGNOSIS — R0789 Other chest pain: Secondary | ICD-10-CM | POA: Diagnosis present

## 2022-05-13 DIAGNOSIS — R079 Chest pain, unspecified: Secondary | ICD-10-CM | POA: Insufficient documentation

## 2022-05-13 DIAGNOSIS — R091 Pleurisy: Secondary | ICD-10-CM | POA: Diagnosis not present

## 2022-05-13 DIAGNOSIS — Z1152 Encounter for screening for COVID-19: Secondary | ICD-10-CM | POA: Insufficient documentation

## 2022-05-13 DIAGNOSIS — T7840XA Allergy, unspecified, initial encounter: Secondary | ICD-10-CM

## 2022-05-13 DIAGNOSIS — R051 Acute cough: Secondary | ICD-10-CM | POA: Diagnosis not present

## 2022-05-13 LAB — RESP PANEL BY RT-PCR (RSV, FLU A&B, COVID)  RVPGX2
Influenza A by PCR: NEGATIVE
Influenza B by PCR: NEGATIVE
Resp Syncytial Virus by PCR: NEGATIVE
SARS Coronavirus 2 by RT PCR: NEGATIVE

## 2022-05-13 LAB — I-STAT BETA HCG BLOOD, ED (MC, WL, AP ONLY): I-stat hCG, quantitative: <5 m[IU]/mL (ref <5)

## 2022-05-13 LAB — CBC
HCT: 36.1 % (ref 36.0–46.0)
Hemoglobin: 12 g/dL (ref 12.0–15.0)
MCH: 30.1 pg (ref 26.0–34.0)
MCHC: 33.2 g/dL (ref 30.0–36.0)
MCV: 90.5 fL (ref 80.0–100.0)
Platelets: 195 10*3/uL (ref 150–400)
RBC: 3.99 MIL/uL (ref 3.87–5.11)
RDW: 13.3 % (ref 11.5–15.5)
WBC: 9.2 10*3/uL (ref 4.0–10.5)
nRBC: 0 % (ref 0.0–0.2)

## 2022-05-13 LAB — BASIC METABOLIC PANEL
Anion gap: 12 (ref 5–15)
BUN: 11 mg/dL (ref 6–20)
CO2: 23 mmol/L (ref 22–32)
Calcium: 9.2 mg/dL (ref 8.9–10.3)
Chloride: 104 mmol/L (ref 98–111)
Creatinine, Ser: 0.64 mg/dL (ref 0.44–1.00)
GFR, Estimated: >60 mL/min (ref >60)
Glucose, Bld: 98 mg/dL (ref 70–99)
Potassium: 3.5 mmol/L (ref 3.5–5.1)
Sodium: 139 mmol/L (ref 135–145)

## 2022-05-13 LAB — TROPONIN I (HIGH SENSITIVITY): Troponin I (High Sensitivity): <2 ng/L (ref <18)

## 2022-05-13 MED ORDER — ASPIRIN 325 MG PO TABS: 325.0000 mg | ORAL_TABLET | Freq: Once | ORAL | Status: AC

## 2022-05-13 MED ORDER — BENZONATATE 100 MG PO CAPS
100.0000 mg | ORAL_CAPSULE | Freq: Once | ORAL | Status: AC
  Administered 2022-05-13: 100 mg via ORAL
  Filled 2022-05-13: qty 1

## 2022-05-13 MED ORDER — FLUTICASONE-SALMETEROL 100-50 MCG/ACT IN AEPB: 1.0000 | INHALATION_SPRAY | Freq: Two times a day (BID) | RESPIRATORY_TRACT | 3 refills | Status: AC

## 2022-05-13 MED ORDER — HYDROCOD POLI-CHLORPHE POLI ER 10-8 MG/5ML PO SUER: 5.0000 mL | Freq: Two times a day (BID) | ORAL | 0 refills | Status: AC | PRN

## 2022-05-13 MED ORDER — AZELASTINE HCL 0.1 % NA SOLN
1.0000 | Freq: Two times a day (BID) | NASAL | 0 refills | Status: AC
Start: 2022-05-13 — End: ?

## 2022-05-13 MED ORDER — ONDANSETRON 4 MG PO TBDP
4.0000 mg | ORAL_TABLET | Freq: Once | ORAL | Status: AC
  Administered 2022-05-13: 4 mg via ORAL
  Filled 2022-05-13: qty 1

## 2022-05-13 MED ORDER — FLUTICASONE PROPIONATE 50 MCG/ACT NA SUSP: 2.0000 | Freq: Every day | NASAL | 6 refills | Status: AC

## 2022-05-13 MED ORDER — ALBUTEROL SULFATE HFA 108 (90 BASE) MCG/ACT IN AERS
1.0000 | INHALATION_SPRAY | Freq: Once | RESPIRATORY_TRACT | Status: DC
  Filled 2022-05-13: qty 6.7

## 2022-05-13 NOTE — ED Provider Notes (Signed)
[place] [facility]GENCY DEPARTMENT AT [facility] Provider Note   CSN: [id number] Arrival date & time: 05/13/22  1214     History  Chief Complaint  Patient presents with   Chest Pain    Spanish interpreter used for the duration of the visit.  Tracey Kane is a 52 y.o. female with PMH significant for HLD who presents with concern for intermittent CP and SOB for around 1 month, worse over the last 3 days. Patient reports ill last week, temperature max of 102.5, large amount of coughing. Went to PCP today and told she had abnormal EKG. She reports chest pain worse with cough, or before she needs to use inhaler. She denies chest pain worse with exertion. Chest pain is sharp / burning in nature. No previous hx of HTN, DM, tobacco use, CAD, stroke. Rates pain 2/10 at this time.   Chest Pain Associated symptoms: cough        Home Medications Prior to Admission medications   Medication Sig Start Date End Date Taking? Authorizing Provider  chlorpheniramine-HYDROcodone (TUSSIONEX) 10-8 MG/5ML Take 5 mLs by mouth every 12 (twelve) hours as needed for cough. 05/13/22  Yes Jackelynn Hosie H, PA-C  albuterol (VENTOLIN HFA) 108 (90 Base) MCG/ACT inhaler INHALE 1 TO 2 PUFFS INTO THE LUNGS EVERY 6 HOURS AS NEEDED FOR WHEEZING OR SHORTNESS OF BREATH 09/11/21   Idalia Needle, [place] J, DO  azelastine (ASTELIN) 0.1 % nasal spray Place 1 spray into both nostrils 2 (two) times daily. Use in each nostril as directed 05/13/22   Bess Kinds, MD  cetirizine (ZYRTEC ALLERGY) 10 MG tablet Take 1 tablet (10 mg total) by mouth daily. Patient not taking: Reported on 12/25/2020 01/01/20   Wallis Bamberg, PA-C  fluticasone [facility]) 50 MCG/ACT nasal spray Place 2 sprays into both nostrils daily. 05/13/22   Bess Kinds, MD  fluticasone-salmeterol (ADVAIR) 100-50 MCG/ACT AEPB Inhale 1 puff into the lungs 2 (two) times daily. 05/13/22   Bess Kinds, MD  norethindrone (ORTHO MICRONOR) 0.35 MG tablet  Take 1 tablet (0.35 mg total) by mouth daily. 02/14/21   Sherlyn Lick, MD  omeprazole (PRILOSEC) 20 MG capsule Take 20 mg by mouth daily.    [provider]  polyethylene glycol powder (GLYCOLAX/MIRALAX) 17 GM/SCOOP powder Take 17 g by mouth daily. 03/19/22   Littie Deeds, MD  predniSONE (STERAPRED UNI-PAK 21 TAB) 10 MG (21) TBPK tablet Take by mouth daily. Take 6 tabs by mouth daily  for 2 days, then 5 tabs for 2 days, then 4 tabs for 2 days, then 3 tabs for 2 days, 2 tabs for 2 days, then 1 tab by mouth daily for 2 days Patient not taking: Reported on 09/18/2020 06/12/20   Rhys Martini, PA-C  Turmeric (QC TUMERIC COMPLEX PO) Take by mouth.    [provider]  loratadine (CLARITIN) 10 MG tablet Take 10 mg by mouth daily as needed for allergies. Please print label in spanish   01/01/20  [provider]      Allergies    Azithromycin and Norgestimate-eth estradiol    Review of Systems   Review of Systems  Respiratory:  Positive for cough.   Cardiovascular:  Positive for chest pain.  All other systems reviewed and are negative.   Physical Exam Updated Vital Signs BP 132/88   Pulse 76   Temp 98.2 F (36.8 C)   Resp 18   SpO2 100%  Physical Exam Vitals and nursing note reviewed.  Constitutional:  General: She is not in acute distress.    Appearance: Normal appearance.  HENT:     Head: Normocephalic and atraumatic.  Eyes:     General:        Right eye: No discharge.        Left eye: No discharge.  Cardiovascular:     Rate and Rhythm: Normal rate and regular rhythm.     Heart sounds: No murmur heard.    No friction rub. No gallop.  Pulmonary:     Effort: Pulmonary effort is normal.     Breath sounds: Normal breath sounds.  Chest:     Comments: No tenderness to palpation of the chest wall on my exam Abdominal:     General: Bowel sounds are normal.     Palpations: Abdomen is soft.  Skin:    General: Skin is warm and dry.     Capillary  Refill: Capillary refill takes less than 2 seconds.  Neurological:     Mental Status: She is alert and oriented to person, place, and time.  Psychiatric:        Mood and Affect: Mood normal.        Behavior: Behavior normal.     ED Results / Procedures / Treatments   Labs (all labs ordered are listed, but only abnormal results are displayed) Labs Reviewed  RESP PANEL BY RT-PCR (RSV, FLU A&B, COVID)  RVPGX2  BASIC METABOLIC PANEL  CBC  I-STAT BETA HCG BLOOD, ED ([facility], [facility], [facility] ONLY)  TROPONIN I (HIGH SENSITIVITY)    EKG EKG Interpretation  Date/Time:  Wednesday May 13 2022 12:23:03 EDT Ventricular Rate:  79 PR Interval:  155 QRS Duration: 91 QT Interval:  388 QTC Calculation: 445 R Axis:   -11 Text Interpretation: Sinus rhythm Low voltage, precordial leads LVH by voltage Borderline T abnormalities, anterior leads Abnormal ECG Confirmed by Gerhard Munch [phone]) on 05/13/2022 2:01:24 PM  Radiology DG Chest 2 View  Result Date: 05/13/2022 CLINICAL DATA:  Chest pain, shortness of breath. EXAM: CHEST - 2 VIEW COMPARISON:  02/20/2011. FINDINGS: Trachea is midline. Heart size normal. There may be minimal streaky atelectasis in the lung bases. No pleural fluid. IMPRESSION: Minimal streaky atelectasis in the lung bases. Electronically Signed   By: Leanna Battles M.D.   On: 05/13/2022 13:43    Procedures Procedures    Medications Ordered in ED Medications  albuterol (VENTOLIN HFA) 108 (90 Base) MCG/ACT inhaler 1 puff (1 puff Inhalation Patient Refused/Not Given 05/13/22 1308)  ondansetron (ZOFRAN-ODT) disintegrating tablet 4 mg (4 mg Oral Given 05/13/22 1307)  benzonatate (TESSALON) capsule 100 mg (100 mg Oral Given 05/13/22 1307)    ED Course/ Medical Decision Making/ A&P Clinical Course as of 05/13/22 1500  Wed May 13, 2022  1457 [facility]: 30.1 [CP]    Clinical Course User Index [CP] Olene Floss, PA-C                             Medical Decision Making Amount and/or  Complexity of Data Reviewed Labs: ordered. Radiology: ordered.  Risk Prescription drug management.   This patient is a 52 y.o. female  who presents to the ED for concern of chest pain, cough.   Differential diagnoses prior to evaluation: The emergent differential diagnosis includes, but is not limited to,  ACS, AAS, PE, Mallory-Weiss, Boerhaave's, Pneumonia, acute bronchitis, asthma or COPD exacerbation, anxiety, MSK pain or traumatic injury to the chest, acid reflux  versus other . This is not an exhaustive differential.   Past Medical History / Co-morbidities: Hyperlipidemia  Additional history: Chart reviewed. Pertinent results include: Reviewed PCP visit just prior to arrival with EKG with nonspecific T wave abnormalities  Physical Exam: Physical exam performed. The pertinent findings include: Patient with stable vital signs on my exam, she has no significant tenderness palpation of the chest wall or upper abdomen.  Lab Tests/Imaging studies: I personally interpreted labs/imaging and the pertinent results include: CBC unremarkable, BMP unremarkable, troponin negative x 1 in context of chest pain that has been ongoing intermittently for several days, no active chest pain at time my evaluation.  RVP negative for COVID, flu, RSV.  And family interpreted plain film chest x-ray which shows some streaky atelectasis at lung bases, no evidence of other acute intrathoracic abnormality.  I agree with the radiologist interpretation.  Cardiac monitoring: EKG obtained and interpreted by my attending physician which shows: Normal sinus rhythm, borderline T wave abnormalities in anterior leads   Medications: I ordered medication including Tessalon, Zofran for nausea, cough.  I have reviewed the patients home medicines and have made adjustments as needed.   Disposition: After consideration of the diagnostic results and the patients response to treatment, I feel that patient with a heart score of  3, her story does not seem suspicious for acute cardiac event with nonexertional intermittent chest pain worse with cough.  I suspect that she is having some pleurisy or musculoskeletal chest pain related to cough, she had a fever, and suspect viral illness just last week.  She does not have any evidence of developing pneumonia, her pain is controlled in the emergency department.  Will DC with Tussionex to help with pleurisy, cough, encourage close PCP follow-up.  emergency department workup does not suggest an emergent condition requiring admission or immediate intervention beyond what has been performed at this time. The plan is: as above. The patient is safe for discharge and has been instructed to return immediately for worsening symptoms, change in symptoms or any other concerns.  Final Clinical Impression(s) / ED Diagnoses Final diagnoses:  Atypical chest pain  Acute cough  Pleurisy    Rx / DC Orders ED Discharge Orders          Ordered    chlorpheniramine-HYDROcodone (TUSSIONEX) 10-8 MG/5ML  Every 12 hours PRN        05/13/22 1452              Scout Gumbs, Wilmot H, PA-C 05/13/22 1500    Gerhard Munch, MD 05/13/22 1701

## 2022-05-13 NOTE — Assessment & Plan Note (Signed)
Patient complaining of Chest Pain (CP) for last week that is constant and sometimes extends to back and throat. She reports the CP improves w/ albuterol use, and notes that sometimes she has SOB, but the albuterol helps. She denies any wheezing or chest tightness, she also denies any reflux, but takes omeprazole prn for reflux. CP most concerning for GERD or Asthma, but obtained EKG in office that showed inverted T-waves in precordial leads (V1-V5). Given patient's current chest pain, and concerning EKG, will send to ED via EMS. Patient agreeable to this.  -Send to ED via EMS -If ED benign, will encourage patient to take omeprazole and Advair daily

## 2022-05-13 NOTE — Discharge Instructions (Addendum)
Utilice Tylenol o ibuprofeno para el dolor. Puede utilizar 600 mg de ibuprofeno cada 6 horas o 1000 mg de Tylenol cada 6 horas. Puede optar por [employer] 2. Esto sera ms efectivo. No exceder los 4 g de Tylenol en 24 horas. No exceder los 3200 mg de ibuprofeno las 24 horas.  Puede usar el jarabe para la tos medicado segn sea necesario para ataques de tos Art gallery manager; pueden pasar varios das hasta que se resuelva el dolor de la tos; le recomiendo que haga un seguimiento con su mdico de atencin primaria segn sea necesario.  No hubo evidencia de ninguna enfermedad cardaca en su examen de hoy.

## 2022-05-13 NOTE — ED Triage Notes (Signed)
Pt arrives via EMS from [facility]. Spanish Interpreter used: Pt reports intermittent cp and sob for about 1 month. Pt reports associated nausea. PT AxOx4. EMS administered 324mg  of aspirin and 1 SL nitro.

## 2022-05-13 NOTE — Progress Notes (Signed)
SUBJECTIVE:   CHIEF COMPLAINT / HPI:   Sore throat and cough Cough for 2 weeks and nasal congestion and itchy eyes. Is also appreciating chest pain, and ear pain more on left than right. Has used alka seltzer for some relief. Is still taking zyrtec daily. Has also been using Flonase and it helps. Denies any reflux symptoms, and uses omeprazole if she has symptoms. Doesn't smoke. Was sick last week for 3 days, after having a fever to 102.5, at that time she had a lot of congestion and chills, w/ body aches. Denies any wheezing or chest tightness.   Chest Pain Has burning chest pain for last week. Pain goes to her back, and sometimes her throat. Has been having some SOB but has been relieved w/ albuterol. Chest pain is constant. The chest pain goes away w/ albuterol but continues to come back. Is not taking her advair.   HM Due for check-up Mammogram Colonoscopy  PERTINENT  PMH / PSH: asthma  Patient Care Team: Cora Collum, DO as PCP - General (Family Medicine) Loletta Specter, PA-C (Physician Assistant) OBJECTIVE:  BP 137/80   Pulse 76   Ht 5\' 4"  (1.626 m)   Wt 154 lb 3.2 oz (69.9 kg)   SpO2 95%   BMI 26.47 kg/m  Physical Exam Constitutional:      General: She is not in acute distress.    Appearance: Normal appearance. She is not ill-appearing.  Cardiovascular:     Rate and Rhythm: Normal rate and regular rhythm.     Pulses: Normal pulses.     Heart sounds: Normal heart sounds. No murmur heard.    No friction rub. No gallop.  Pulmonary:     Effort: Pulmonary effort is normal. No respiratory distress.     Breath sounds: Normal breath sounds. No stridor. No wheezing, rhonchi or rales.  Chest:     Chest wall: No tenderness.  Neurological:     Mental Status: She is alert.      ASSESSMENT/PLAN:  Chest pain, unspecified type Assessment & Plan: Patient complaining of Chest Pain (CP) for last week that is constant and sometimes extends to back and throat. She  reports the CP improves w/ albuterol use, and notes that sometimes she has SOB, but the albuterol helps. She denies any wheezing or chest tightness, she also denies any reflux, but takes omeprazole prn for reflux. CP most concerning for GERD or Asthma, but obtained EKG in office that showed inverted T-waves in precordial leads (V1-V5). Given patient's current chest pain, and concerning EKG, will send to ED via EMS. Patient agreeable to this.  -Send to ED via EMS -If ED benign, will encourage patient to take omeprazole and Advair daily  Orders: -     EKG 12-Lead -     Aspirin  Allergy, initial encounter Assessment & Plan: Patient complaining of sore throat, nasal congestion, itchy eyes, cough and ear pain, for last 2 weeks. She takes zyrtec and uses Flonase regularly. She denies any reflux and uses omeprazole for reflux symptoms when she has it. She also denies any wheezing or chest tightness. She's been in normal state of health, but was sick for 3 days last week after having a fever of 102. Patient symptoms most concerning for allergies given symptoms and timing. Also considered asthma, GERD, and infection, but all are unlikely. Patient could also be suffering from post viral cough, but doesn't explain the congestion. Will trial allergy meds to see if patient get's some  benefit.  -Flonase, 1 spray both nostrils daily -Azelastine nasal spray, 1 spray both nostrils BID -Zyrtec 10 mg, can double if needed -Advair   Other orders -     Fluticasone Propionate; Place 2 sprays into both nostrils daily.  Dispense: 16 g; Refill: 6 -     Azelastine HCl; Place 1 spray into both nostrils 2 (two) times daily. Use in each nostril as directed  Dispense: 30 mL; Refill: 0 -     Fluticasone-Salmeterol; Inhale 1 puff into the lungs 2 (two) times daily.  Dispense: 1 each; Refill: 3   No follow-ups on file. Bess Kinds, MD 05/13/2022, 12:47 PM PGY-2, [facility]

## 2022-05-13 NOTE — Progress Notes (Signed)
I saw and spoke with [name] with Dr Barbaraann Faster. She ntoes a week of constant, burning pain, sometimes radiates to her back, worse with movement. Atypical in nature, mild TTP of her chest wall but she states this is different. Heart RRR, no murmurs, lungs CTAB. EKG reviewed with Dr Barbaraann Faster showing new TWI in lead III, V3 and V4. No ST segment changes, but no EKG present for comparison. Due to new EKG changes and continued chest pain, recommended to ED by EMS which she is in agreement with. Aspirin 325 mg chewable given.  I agree with the assessment and plan as documented below.  Burley Saver, MD

## 2022-05-13 NOTE — Assessment & Plan Note (Signed)
Patient complaining of sore throat, nasal congestion, itchy eyes, cough and ear pain, for last 2 weeks. She takes zyrtec and uses Flonase regularly. She denies any reflux and uses omeprazole for reflux symptoms when she has it. She also denies any wheezing or chest tightness. She's been in normal state of health, but was sick for 3 days last week after having a fever of 102. Patient symptoms most concerning for allergies given symptoms and timing. Also considered asthma, GERD, and infection, but all are unlikely. Patient could also be suffering from post viral cough, but doesn't explain the congestion. Will trial allergy meds to see if patient get's some benefit.  -Flonase, 1 spray both nostrils daily -Azelastine nasal spray, 1 spray both nostrils BID -Zyrtec 10 mg, can double if needed -Advair

## 2022-05-13 NOTE — Patient Instructions (Addendum)
It was great to see you! Thank you for allowing me to participate in your care!  It looks like your allergies are bothering you. We will give you an extra medicine to take to help with this.   Our plans for today:   Allergies Nasal Sprays - Flonase 1 spary each nostril, daily - Azelastine 1 spray each nostril twice a day *Use both as long as allergy season last. May not need both in the future going forward, after allergy season  Zyrtec  Continue taking zyrtec daily, 10 mg  Can increase to 2 zyrtec in a day if needed.  *can double for allergy season but then reduce to normal dose in 1-2 months, after allergy season  Chest Pain Because you are having chest pain now, and your EKG was concerning. We are sending you to the Emergency Department. If the Emergency Department says everything is fine, this may be coming from Reflux or GERD. Try taking your omeprazole daily to see if this helps with symptoms. This could also be coming from your Asthma. I'll refill your Advair to be taken daily.  -Omeprazole daily  -Advair daily  Make an appointment for a check up!  You are due for a colonoscopy and mammogram   Take care and seek immediate care sooner if you develop any concerns.   Dr. Bess Kinds, MD [facility]   En Espaol  Estuvo muy bien verte! Gracias por permitirme participar en su cuidado!  Parece que tus alergias te estn [place]. Le daremos un medicamento adicional para ayudar con esto.  Nuestros planes para hoy:  Alergias Aerosoles nasales - Flonase 1 pulverizacin en cada fosa nasal, al da - Azelastina 1 pulverizacin en cada fosa nasal dos veces al da *Utilice ambos mientras dure la temporada de [place]. Es posible que no necesite ambos en el futuro, despus de la temporada de [place].  Zyrtec Contine tomando zyrtec diariamente, 10 mg. Puede aumentar a 2 zyrtec por da si es necesario. *puede duplicarse durante la temporada de [place], pero  luego reducirse a la dosis normal en 1 o 2 meses, despus de la temporada de [place].  Dolor en el pecho Porque ahora tienes dolor en el pecho y tu electrocardiograma era preocupante. Lo enviaremos al [employer] . Si el Departamento [employer] que todo est bien, es posible que esto se deba a reflujo o ERGE. Intente tomar omeprazol diariamente para ver si esto ayuda con los sntomas. Esto tambin podra deberse a su asma. Reabastecer su Advair para tomarlo diariamente. -Omeprazol al da -Avanzar diariamente  Pide cita para un chequeo! Debe realizarse una colonoscopia y [place].  Tenga cuidado y busque atencin inmediata lo antes posible si tiene alguna inquietud.  Dr. Bess Kinds, MD [facility] familiar de [place]

## 2022-05-13 NOTE — ED Notes (Signed)
Patient transported to X-ray 

## 2023-02-17 ENCOUNTER — Ambulatory Visit (INDEPENDENT_AMBULATORY_CARE_PROVIDER_SITE_OTHER): Payer: BC Managed Care – PPO | Admitting: Family Medicine

## 2023-02-17 ENCOUNTER — Ambulatory Visit: Payer: BC Managed Care – PPO

## 2023-02-17 VITALS — BP 120/70 | HR 73 | Ht 64.0 in | Wt 163.2 lb

## 2023-02-17 DIAGNOSIS — L2082 Flexural eczema: Secondary | ICD-10-CM

## 2023-02-17 DIAGNOSIS — Z23 Encounter for immunization: Secondary | ICD-10-CM | POA: Diagnosis not present

## 2023-02-17 MED ORDER — TRIAMCINOLONE ACETONIDE 0.5 % EX OINT
1.0000 | TOPICAL_OINTMENT | Freq: Two times a day (BID) | CUTANEOUS | 0 refills | Status: AC
Start: 2023-02-17 — End: 2023-03-03

## 2023-02-17 NOTE — Patient Instructions (Addendum)
It was great to see you! Thank you for allowing me to participate in your care!  Our plans for today:  - Please start using CeraVe eczema cream twice daily, especially after you bathe. - You may also apply Vaseline to the areas that are bothering. - Please start using Triamcinolone cream twice per day for 2 weeks.   Please arrive 15 minutes PRIOR to your next scheduled appointment time! If you do not, this affects OTHER patients' care.  Take care and seek immediate care sooner if you develop any concerns.   Celine Mans, MD, PGY-2 [facility] 9:45 AM 02/17/2023  [facility]

## 2023-02-17 NOTE — Progress Notes (Signed)
    SUBJECTIVE:   CHIEF COMPLAINT / HPI: rash  Feels it started after eating sushi. Began 1 day after.  First time eating sushi. Worse with scratching. Is itchy.  Feels hot.  Aloe vera helps for few minutes. Started last week. Itchiness is there all the time. Uses an exfoliator in the shower, this causes the rash to appear. Itching all over body.  Worse in flexor surfaces of elbows. No new creams or lotions. Wore some new clothes, started 3 weeks ago.  No bug bites. No new medications.   PERTINENT  PMH / PSH: Migraines, Asthma, GERD  OBJECTIVE:   BP 120/70   Pulse 73   Ht 5\' 4"  (1.626 m)   Wt 163 lb 3.2 oz (74 kg)   SpO2 95%   BMI 28.01 kg/m   General: NAD, well appearing Neuro: A&O Respiratory: normal WOB on RA Extremities: Moving all 4 extremities equally Skin: mild erythema with excoriation on bilateral flexural elbow   ASSESSMENT/PLAN:   Assessment & Plan Flexural eczema Exam consistent with atopic dermatitis. Interestingly, no documented or patient knowledge prior history of eczema, it odd for presentation at this age. If not improving with plan below would consider punch biopsy. Patient agreeable to plan below: -Triamcinolone 0.5%% BID for 2 weeks to affected areas -Twice daily especially after bathing emollient use -Twice daily Eczema moisturizer (CeraVe, Aquaphor, etc).  Encounter for immunization Pneumococcal, Flu, and Covid vaccines administered today.  Return in about 1 month (around 03/17/2023) for PCP Health maintenance.  Celine Mans, MD [facility]

## 2023-03-23 ENCOUNTER — Ambulatory Visit (INDEPENDENT_AMBULATORY_CARE_PROVIDER_SITE_OTHER): Payer: BC Managed Care – PPO | Admitting: Family Medicine

## 2023-03-23 ENCOUNTER — Encounter: Payer: Self-pay | Admitting: Family Medicine

## 2023-03-23 VITALS — Ht 64.0 in | Wt 164.8 lb

## 2023-03-23 DIAGNOSIS — B351 Tinea unguium: Secondary | ICD-10-CM

## 2023-03-23 DIAGNOSIS — K219 Gastro-esophageal reflux disease without esophagitis: Secondary | ICD-10-CM

## 2023-03-23 DIAGNOSIS — Z1211 Encounter for screening for malignant neoplasm of colon: Secondary | ICD-10-CM

## 2023-03-23 DIAGNOSIS — Z1231 Encounter for screening mammogram for malignant neoplasm of breast: Secondary | ICD-10-CM

## 2023-03-23 DIAGNOSIS — J45909 Unspecified asthma, uncomplicated: Secondary | ICD-10-CM

## 2023-03-23 MED ORDER — ALBUTEROL SULFATE HFA 108 (90 BASE) MCG/ACT IN AERS: INHALATION_SPRAY | RESPIRATORY_TRACT | 3 refills | Status: DC

## 2023-03-23 MED ORDER — CICLOPIROX 8 % EX SOLN
Freq: Every day | CUTANEOUS | 0 refills | Status: AC
Start: 2023-03-23 — End: ?

## 2023-03-23 NOTE — Progress Notes (Unsigned)
    SUBJECTIVE:   CHIEF COMPLAINT / HPI:   MV is a 53yo F w/ hx of migraines, GERD, stress incontinence that presents for toe fungus.  Toenail Fungus - Reports thickened toenail on R big toe since grandson ran over with a toy bike. - Pt reports that the nails can sometimes feel like its digging in and causes pain - Has tried a topical medicine from [place], unsure what the med is - Sister told her to ask about fluconazole  GERD - feeling acid reflux acting up and burping all day long - feels burning sensation - takes omeprazole daily  Mammogram - last got this 3 years ago, no concerns at that time - this was done at [facility] in [place] - She now lives in [place]  colonoscopy - is interested in colonoscopy    Family Hx - Father passed away from cardiac arrest and was on dialysis - Mother has thyroid issues, still alive - No known family history of cancer  Menstruation Hx - stopped having periods a few years ago when she got her mirena - Still has her mirena in  OBJECTIVE:   Ht 5\' 4"  (1.626 m)   Wt 164 lb 12.8 oz (74.8 kg)   LMP  (LMP Unknown)   BMI 28.29 kg/m   General: Alert, pleasant well-appearing woman. NAD. HEENT: NCAT. MMM. CV: RRR, no murmurs.  Resp: CTAB, no wheezing or crackles. Normal WOB on RA.  Ext: Moves all ext spontaneously Skin: Warm, well perfused. Thickened R big toe (exam limited due to nail polish)   ASSESSMENT/PLAN:   Assessment & Plan Onychomycosis Exam consistent with onychomycosis.  Will start topical ciclopirox lacquer daily.  Counseled patient that treatment will take months to a year.  Counseled patient to keep her feet dry when possible. -Start ciclopirox lacquer daily for 12 months Screen for colon cancer Due for colonoscopy.  Referral sent for GI. Encounter for screening mammogram for malignant neoplasm of breast Mammogram ordered.  Advised to schedule with [facility] breast center. Gastroesophageal reflux  disease, unspecified whether esophagitis present Persistent GERD symptoms, despite taking omeprazole consistently. - Counseled on diet changes. Advised to keep food diary to identify triggers. - Cont Omeprazole 20mg  daily. Can increase to 40mg  if persistent.    Lincoln Brigham, MD [facility]

## 2023-03-23 NOTE — Patient Instructions (Addendum)
 Me alegra verte hoy. Gracias por venir.  Asuntos que hablamos hoy:  1) Debe hacerse una colonoscopia. Recomendamos que se la haga cada 10 aos para Engineer, site de colon.  Le envi una referencia para que vea al gastroenterlogo. Le llamarn para programar una cita. Si no tiene noticias suyas en la prxima semana, avsenos para que podamos asegurarnos de programarla.  2) Tambin debe hacerse una mamografa. Recomendamos que se la haga cada 2 aos para Engineer, site de mama.  Llame al [employer] de [address] de [place] para programar [place].  [place] [facility] [place] [address] [place], [place] [address] [phone]  3) Para el reflujo cido - Contine tomando omeprazol a diario - Empiece a llevar un diario de alimentos para registrar los alimentos que consume y cmo afectan sus sntomas. Los desencadenantes comunes del reflujo cido incluyen comidas picantes, muy cidas o [place], comidas copiosas o [place]. Si identifica ciertos desencadenantes, intente evitarlos o limitar la cantidad que consume. Tambin puede intentar comer porciones ms pequeas y con mayor frecuencia. -Comntelo con el gastroenterlogo cuando vaya a su colonoscopia; l tambin puede ayudar a Higher education careers adviser sus sntomas.  Por favor, traiga siempre sus frascos de medicamentos.  Vuelva a verme en 6 meses o antes si tiene alguna inquietud.    Good to see you today - Thank you for coming in  Things we discussed today:  1) You are due for a colonoscopy. We recommend getting this done every 10 years to check for colon cancers.  -I have sent a referral for you to see the gastroenterologist.  They will call you to help schedule.  If you do not hear from them in the next week, let us know so that we can make sure you get scheduled.  2) you are also due for a mammogram.  We recommend getting this done every 2 years to check for breast cancers.   Please call the [facility] to schedule a mammogram.  [facility] [address] [place],  [place]  [address] [phone]   3) For you acid reflux - Continue taking your omeprazole daily - Start keeping a food diary to keep track of foods you eat and how they affect your symptoms.  Common triggers for acid reflux include spicy foods, very sour or acidic foods, large meals, or very greasy meals.  If you identify certain triggers, try to avoid or limit how much of those foods you eat.  You can also try to have smaller and more frequent meals. -Mention this to the GI doctors when you go for your colonoscopy, they can help further manage her symptoms as well.  Please always bring your medication bottles  Come back to see me in 6 months or sooner if you have concerns.

## 2023-03-25 NOTE — Assessment & Plan Note (Signed)
 Persistent GERD symptoms, despite taking omeprazole consistently. - Counseled on diet changes. Advised to keep food diary to identify triggers. - Cont Omeprazole 20mg  daily. Can increase to 40mg  if persistent.

## 2023-04-08 ENCOUNTER — Encounter: Payer: Self-pay | Admitting: Physician Assistant

## 2023-06-07 NOTE — Progress Notes (Addendum)
 06/08/2023 Tracey Kane [id number] Jan 15, 1970  Referring provider: Albin Huh, MD Primary GI doctor: Dr. Yvone Herd  ASSESSMENT AND PLAN:  Screening colonoscopy No family history of colon cancer, no change in bowel habits, no hematochezia.  Schedule colonoscopy at [facility], We have discussed the risks of bleeding, infection, perforation, medication reactions, and remote risk of death associated with colonoscopy. All questions were answered and the patient acknowledges these risk and wishes to proceed.  Constipation  Occasional, controls with diet/hydration - Increase fiber/ water intake, decrease caffeine, increase activity level.  GERD with intermittent dysphagia worse with rice/bread/beef with eruptations with even water On prilosec 20 mg daily She is on tumeric before bed and she is on advil  3-4 x a week for headaches - stop tumeric - avoid NSAIDS - increase omeprazole  20 mg BID for 3 months -alginate therapy given -Lifestyle changes discussed, avoid NSAIDS, ETOH, hand out given to the patient -Weight loss discussed with the patient -Schedule EGD with possible dilation at [facility] to evaluate GERD, esophagitis, EOE with asthma history, hiatal hernia, H pylori,I discussed risks of EGD with patient today, including risk of sedation, bleeding or perforation. Patient provides understanding and gave verbal consent to proceed. - consider RUQ US  and HIDA pending results but no pain on exam - consider testing for alpha gal but has no tick exposure  Gas Has been years, no AB pain, no significant bloating Upper and lower increase in gas - add on gas - X -Check for celiac.  Will optimize bowel regiment for possible IBS/constipation. -Given FODMAP information and information about gas producing foods - consider trial of xiphaxin/flagyl   Asthma On advair, well controlled No history of yeast or odynophagia Rule out EOE  I have reviewed the clinic note as outlined by Santina Cull, PA and agree with the assessment, plan and medical decision making.  Ms. Varga Barraza presents to the office today for evaluation of bloating, gas, constipation and dysphagia.  Longstanding history of gas and bloating with eructation and flatulence.  Also notes symptoms of constipation that she has managed with diet.  No prior colonoscopy -due for screening colonoscopy.  Has solid food dysphagia with rice and bread.  I agree with laboratory studies and endoscopic evaluation with EGD to evaluate for esophagitis, EOE, rule out stricture.  Can evaluate for H. pylori and celiac.  Appropriate to schedule screening colonoscopy.  Eugenia Hess, MD  Patient Care Team: Albin Huh, MD as PCP - General (Family Medicine) Gomez, Roger David, PA-C (Physician Assistant)  HISTORY OF PRESENT ILLNESS: 53 y.o. female with a past medical history listed below presents for evaluation of colonoscopy with GERD/dysphagia   Discussed the use of AI scribe software for clinical note transcription with the patient, who gave verbal consent to proceed.  History of Present Illness   Tracey Kane is a 53 year old female who presents with gastrointestinal symptoms including bloating, gas, and difficulty swallowing.  She has experienced bloating and excessive gas for several years, characterized by both burping and flatulence. These symptoms occur after consuming various foods and even water. The bloating is not painful but is associated with significant gas production. While the symptoms are not consistently linked to specific foods, she notes that beans, vegetables, and water can exacerbate the gas.  She experiences intermittent constipation, which she manages by increasing water intake and consuming salads. No diarrhea, blood in the stool, or dark black stools are reported.  She reports occasional difficulty swallowing, particularly with  certain foods such as rice and bread. This issue is  intermittent. Additionally, she experiences nausea when consuming beef. No history of yeast infections in the mouth or painful swallowing is noted.  She is currently taking omeprazole  for her symptoms and uses Advil  three to four times a week, primarily for headaches. She also takes turmeric supplements. She denies any use of Aleve, ibuprofen , or Goody powders.  She has experienced weight fluctuations, noting weight loss followed by a return to her original weight without a clear reason. No nausea or vomiting is reported. She does not smoke or consume alcohol.        She  reports that she has never smoked. She has never used smokeless tobacco. She reports that she does not drink alcohol and does not use drugs.  RELEVANT GI HISTORY, IMAGING AND LABS: Results         04/02/2016  IMPRESSION: Free fluid in the pelvis and right adnexa, minimally complex. Appendix not confidently identified, normal or abnormal. Considerations include adnexal process such as ruptured ovarian cyst or pelvic inflammatory disease versus occult appendicitis obscured by adjacent free fluid.   Pelvic ultrasound could be considered for evaluation of the adnexal structures. A repeat exam could be considered after administration of enteric contrast to better define enteric structures.   Small amount of perihepatic and perisplenic fluid is likely reactive secondary to the pelvic process. CBC    Component Value Date/Time   WBC 9.2 05/13/2022 1227   RBC 3.99 05/13/2022 1227   HGB 12.0 05/13/2022 1227   HGB 11.9 03/19/2022 1654   HCT 36.1 05/13/2022 1227   HCT 35.3 03/19/2022 1654   PLT 195 05/13/2022 1227   PLT 198 03/19/2022 1654   MCV 90.5 05/13/2022 1227   MCV 91 03/19/2022 1654   MCH 30.1 05/13/2022 1227   MCHC 33.2 05/13/2022 1227   RDW 13.3 05/13/2022 1227   RDW 12.8 03/19/2022 1654   LYMPHSABS 2.6 03/19/2022 1654   MONOABS 0.3 09/10/2009 2054   EOSABS 0.2 03/19/2022 1654   BASOSABS 0.0  03/19/2022 1654   No results for input(s): "HGB" in the last 8760 hours.  CMP     Component Value Date/Time   NA 139 05/13/2022 1227   NA 142 03/19/2022 1654   K 3.5 05/13/2022 1227   CL 104 05/13/2022 1227   CO2 23 05/13/2022 1227   GLUCOSE 98 05/13/2022 1227   BUN 11 05/13/2022 1227   BUN 15 03/19/2022 1654   CREATININE 0.64 05/13/2022 1227   CREATININE 0.52 06/26/2011 1114   CALCIUM 9.2 05/13/2022 1227   PROT 7.0 03/19/2022 1654   ALBUMIN 4.4 03/19/2022 1654   AST 17 03/19/2022 1654   ALT 20 03/19/2022 1654   ALKPHOS 70 03/19/2022 1654   BILITOT <0.2 03/19/2022 1654   GFRNONAA >60 05/13/2022 1227   GFRAA >60 04/03/2016 0528      Latest Ref Rng & Units 03/19/2022    4:54 PM 07/22/2021    3:04 PM 04/01/2016    9:54 PM  Hepatic Function  Total Protein 6.0 - 8.5 g/dL 7.0  7.4  7.5   Albumin 3.9 - 4.9 g/dL 4.4  4.1  4.4   AST 0 - 40 IU/L 17  18  20    ALT 0 - 32 IU/L 20  18  18    Alk Phosphatase 44 - 121 IU/L 70  61  48   Total Bilirubin 0.0 - 1.2 mg/dL <1.6  0.6  0.5  Current Medications:   Current Outpatient Medications (Endocrine & Metabolic):    levonorgestrel  (MIRENA , 52 MG,) 20 MCG/DAY IUD, 1 each by Intrauterine route once.     Current Outpatient Medications (Respiratory):    albuterol  (VENTOLIN  HFA) 108 (90 Base) MCG/ACT inhaler, INHALE 1 TO 2 PUFFS INTO THE LUNGS EVERY 6 HOURS AS NEEDED FOR WHEEZING OR SHORTNESS OF BREATH   azelastine  (ASTELIN ) 0.1 % nasal spray, Place 1 spray into both nostrils 2 (two) times daily. Use in each nostril as directed (Patient not taking: Reported on 06/08/2023)   benzonatate  (TESSALON ) 200 MG capsule, Take 200 mg by mouth 3 (three) times daily as needed. (Patient not taking: Reported on 06/08/2023)   cetirizine  (ZYRTEC  ALLERGY) 10 MG tablet, Take 1 tablet (10 mg total) by mouth daily. (Patient not taking: Reported on 06/08/2023)   chlorpheniramine-HYDROcodone  (TUSSIONEX) 10-8 MG/5ML, Take 5 mLs by mouth every 12 (twelve) hours as  needed for cough. (Patient not taking: Reported on 06/08/2023)   fluticasone  (FLONASE ) 50 MCG/ACT nasal spray, Place 2 sprays into both nostrils daily. (Patient not taking: Reported on 06/08/2023)   fluticasone -salmeterol (ADVAIR) 100-50 MCG/ACT AEPB, Inhale 1 puff into the lungs 2 (two) times daily. (Patient not taking: Reported on 06/08/2023)    Current Facility-Administered Medications (Analgesics):    aspirin  tablet 325 mg    Current Outpatient Medications (Other):    ciclopirox  (PENLAC ) 8 % solution, Apply topically at bedtime. Apply over nail and surrounding skin. Apply daily over previous coat. After seven (7) days, may remove with alcohol and continue cycle.   famotidine  (PEPCID ) 40 MG tablet, Take 1 tablet (40 mg total) by mouth at bedtime.   Na Sulfate-K Sulfate-Mg Sulfate concentrate (SUPREP) 17.5-3.13-1.6 GM/177ML SOLN, Take 1 kit (354 mLs total) by mouth once for 1 dose.   omeprazole  (PRILOSEC) 20 MG capsule, Take 20 mg by mouth daily.   Turmeric (QC TUMERIC COMPLEX PO), Take by mouth.   polyethylene glycol powder (GLYCOLAX /MIRALAX ) 17 GM/SCOOP powder, Take 17 g by mouth daily. (Patient not taking: Reported on 06/08/2023)   Medical History:  Past Medical History:  Diagnosis Date   Asthma    GERD (gastroesophageal reflux disease)    Allergies:  Allergies  Allergen Reactions   Azithromycin      REACTION: caused Nausea, sweating; patient denied this occurred and gave her a dose on 04/28/10 and she did fine.  May have been a one time adverse event vs an allergy.  Saxon NP   Norgestimate-Eth Estradiol     REACTION: caused mouth swelling     Surgical History:  She  has a past surgical history that includes Knee surgery (Left) and laparoscopic appendectomy (N/A, 04/02/2016). Family History:  Her family history includes Diabetes in her father and sister; Heart Problems in her son; Heart disease in her father; Hyperlipidemia in her father; Hypertension in her father and  sister.  REVIEW OF SYSTEMS  : All other systems reviewed and negative except where noted in the History of Present Illness.  PHYSICAL EXAM: BP 100/70 (BP Location: Left Arm, Patient Position: Sitting, Cuff Size: Normal)   Pulse 72   Ht 5' 1.5" (1.562 m) Comment: height measured without shoes  Wt 164 lb 2 oz (74.4 kg)   BMI 30.51 kg/m  Physical Exam   GENERAL APPEARANCE: Well nourished, in no apparent distress. HEENT: No cervical lymphadenopathy, unremarkable thyroid, sclerae anicteric, conjunctiva pink. RESPIRATORY: Respiratory effort normal, breath sounds equal bilaterally without rales, rhonchi, or wheezing. CARDIO: Regular rate and rhythm with no murmurs,  rubs, or gallops, peripheral pulses intact. ABDOMEN: Soft, non-distended, active bowel sounds in all four quadrants, slight epigastric tenderness, no rebound, no mass appreciated, negative Murphy's sign. RECTAL: Declines. MUSCULOSKELETAL: Full range of motion, normal gait, without edema. SKIN: Dry, intact without rashes or lesions. No jaundice. NEURO: Alert, oriented, no focal deficits. PSYCH: Cooperative, normal mood and affect.      Edmonia Gottron, PA-C 10:20 AM

## 2023-06-08 ENCOUNTER — Ambulatory Visit (INDEPENDENT_AMBULATORY_CARE_PROVIDER_SITE_OTHER): Admitting: Physician Assistant

## 2023-06-08 ENCOUNTER — Encounter: Payer: Self-pay | Admitting: Physician Assistant

## 2023-06-08 VITALS — BP 100/70 | HR 72 | Ht 61.5 in | Wt 164.1 lb

## 2023-06-08 DIAGNOSIS — R1310 Dysphagia, unspecified: Secondary | ICD-10-CM | POA: Diagnosis not present

## 2023-06-08 DIAGNOSIS — R143 Flatulence: Secondary | ICD-10-CM | POA: Diagnosis not present

## 2023-06-08 DIAGNOSIS — K5900 Constipation, unspecified: Secondary | ICD-10-CM

## 2023-06-08 DIAGNOSIS — Z1211 Encounter for screening for malignant neoplasm of colon: Secondary | ICD-10-CM

## 2023-06-08 DIAGNOSIS — K219 Gastro-esophageal reflux disease without esophagitis: Secondary | ICD-10-CM | POA: Diagnosis not present

## 2023-06-08 DIAGNOSIS — R140 Abdominal distension (gaseous): Secondary | ICD-10-CM

## 2023-06-08 DIAGNOSIS — R1319 Other dysphagia: Secondary | ICD-10-CM

## 2023-06-08 MED ORDER — NA SULFATE-K SULFATE-MG SULF 17.5-3.13-1.6 GM/177ML PO SOLN: 1.0000 | Freq: Once | ORAL | 0 refills | Status: AC

## 2023-06-08 MED ORDER — FAMOTIDINE 40 MG PO TABS: 40.0000 mg | ORAL_TABLET | Freq: Every day | ORAL | 0 refills | Status: AC

## 2023-06-08 NOTE — Patient Instructions (Addendum)
 Your provider has requested that you go to the basement level for lab work before leaving today. Press "B" on the elevator. The lab is located at the first door on the left as you exit the elevator.  You have been scheduled for an endoscopy and colonoscopy. Please follow the written instructions given to you at your visit today.  If you use inhalers (even only as needed), please bring them with you on the day of your procedure.  DO NOT TAKE 7 DAYS PRIOR TO TEST- Trulicity (dulaglutide) Ozempic, Wegovy (semaglutide) Mounjaro (tirzepatide) Bydureon Bcise (exanatide extended release)  DO NOT TAKE 1 DAY PRIOR TO YOUR TEST Rybelsus (semaglutide) Adlyxin (lixisenatide) Victoza (liraglutide) Byetta (exanatide) ___________________________________________________________________________   Miralax  es un laxante osmtico.  Slo aporta ms agua a las heces.  Es seguro tomarlo a diario.  Puede tomar hasta 17 gramos de miralax  [employer].  Mezclar con jugo o caf.  Comience con 1 tapn por la noche durante 3-4 das y reevale su respuesta en 3-4 das.  Puede aumentar y disminuir la dosis segn su respuesta.  Recuerde, pueden pasar entre 3 y 4 das hasta que surta efecto O hasta que los efectos desaparezcan.   A menudo lo combino con Benefit por la maana para ayudar a asegurar que las heces no estn demasiado sueltas.  Tome su inhibidor de la bomba de protones, omerapzol 20 mg. Tmelo de 30 minutos a 1 hora antes de las comidas para que sea ms efectivo. Agregue Pepcid  por la noche. Suspenda la crcuma. Evite las comidas picantes y cidas. Evite las comidas grasas. Limite el consumo de caf, t, alcohol y bebidas carbonatadas. Esfurcese por mantener un peso saludable. Mantenga la cabecera de la cama elevada al menos 7,5 cm con bloques o una almohada de cua si presenta algn sntoma nocturno. Permanezca erguido durante 2 horas despus de comer. Evite las comidas y refrigerios de 3 a  4 horas antes de [place].   Plan de alimentacin con bajo contenido de FODMAP Low-FODMAP Eating Plan  FODMAP significa oligosacridos, disacridos, monosacridos y polioles fermentables. Son azcares difciles de digerir para International aid/development worker. Un plan de alimentacin con bajo contenido de FODMAP puede ayudar a algunas personas que tienen el sndrome de colon irritable (SCI) y Materials engineer enfermedades de los intestinos (intestinales) a Chief Operating Officer sus sntomas. Seguir [employer] plan de alimentacin puede resultar complicado. Consulte con un especialista en dietas y nutricin (nutricionista) para disear un plan de alimentacin con bajo contenido de FODMAP que sea adecuado para usted. Un nutricionista puede asegurarse de que consuma suficientes nutrientes con este plan de alimentacin. Consejos para seguir Consulting civil engineer las etiquetas de los alimentos Lea las etiquetas para detectar FODMAP ocultos, por ejemplo: Jarabe de alta fructosa. Miel. Agave. Saborizantes de frutas naturales. Ajo o cebolla en polvo. Elija alimentos con bajo contenido de FODMAP que contengan 3 o 4 gramos de fibra por porcin. Consulte las etiquetas de los alimentos para Solicitor los tamaos de las porciones. Coma solo una porcin por vez para asegurarse de [employer] niveles de FODMAP. De compras Compre con una lista de alimentos recomendados para esta dieta y haga un plan de comidas. Planificacin de las comidas Siga un plan de alimentacin con bajo contenido de FODMAP durante un mximo de 6 semanas, o segn le indique el mdico o nutricionista. Para seguir el plan de alimentacin, haga lo siguiente: Elimine los alimentos con alto contenido de FODMAP de su dieta por completo. Seleccione slo alimentos bajos en FODMAP para  comer. Lo har durante 2 a 6 semanas. Vuelva a introducir los alimentos con alto contenido de FODMAP en su dieta gradualmente, de uno a la vez. En su mayora, la gente debe esperar unos das antes de  introducir la siguiente comida con alto contenido de FODMAP en su plan de comidas. Su nutricionista puede recomendarle con qu rapidez debera volver a introducir los alimentos. Lleve un registro diario de qu y cunto come y bebe. Anote cualquier sntoma que tenga despus de comer. Revise su registro diario con el nutricionista regularmente para identificar qu alimentos puede comer y qu alimentos debe evitar. Consejos generales Beba suficiente lquido todos los [address] para mantener la orina de color amarillo plido. No consuma alimentos procesados. Con frecuencia, estos alimentos tienen azcar agregada y pueden tener un alto contenido de FODMAP. Evite la [employer] de los productos lcteos, los cereales integrales y los endulzantes. Consulte con un nutricionista para asegurarse de incluir suficiente fibra en su alimentacin. Evite los alimentos con alto contenido de FODMAP en las comidas para controlar los sntomas. Alimentos recomendados [address], [place], [place], limones, limas, arndanos, frambuesas, fresas, uvas, meln, meln dulce, kiwi, papaya, maracuy y pia. Cantidades limitadas de arndanos rojos disecados, chips de banana y coco rallado. Verduras [place], calabacn, pepino, pimientos, judas verdes, brotes de soja, lechuga, rcula, kale, acelga, espinaca, col berza, col china, calabaza amarilla, papa y [place]. Cantidades limitadas de maz, zanahoria y camote. La parte verde de los cebollines. Granos Cereales sin gluten, como arroz, avena, trigo sarraceno, quinua, maz, polenta y mijo. Pasta, pan y cereal sin gluten. Fideos de Surveyor, minerals. Tortillas de maz. Carnes y [address] protenas Carne de res, cerdo, aves o pescado sin sazonar. Huevos. [place]ino. Tofu (firme) y tempeh. Cantidades limitadas de frutos secos y [address], como almendras, nueces, nueces de [place], [place], [place], semillas de [place], semillas de cha y semillas de [place]. Mar [place], yogur y Charity fundraiser sin Advice worker.  Queso requesn y helado sin lactosa. Leches que no sean de origen animal, por ejemplo, de almendras, coco, camo y arroz. Yogur no lcteo. Cantidades limitadas de queso de [place], [place], [place], parmesano, suizo y otros quesos duros. Grasas y aceites Pastas para untar sin manteca. Aceites vegetales, como el de oliva, de canola y de girasol. Condimentos y otros alimentos Endulzantes artificiales con nombres que no terminen en "ol", como aspartamo, sacarina y [place]. Charolett Copes de arce, azcar blanca, azcar sin refinar, azcar morena y melaza. Mayonesa, salsa de soja y tamari. [place], cilantro, perejil, romero y [employer]. Bebidas Katheren Palomino y agua mineral. Refrescos endulzados con azcar. Pequeas cantidades de [place] de naranjas o jugo de arndanos rojos. T negro y verde. La mayora de los vinos secos. Caf. Es posible que los productos que se enumeran ms [place] no constituyan una lista completa de los alimentos y las bebidas que puede tomar. Consulte a un nutricionista para obtener ms informacin. Alimentos que deben evitarse [place], pera, sanda, durazno, ciruela, cereza, damasco, mora, mora de [place], higo, nectarina y Horticulturist, commercial, en forma fresca, disecada o en jugo. [place]. Verduras Raz de [place], alcachofa, esprrago, repollo, arvejas, repollitos de [place], brcoli, guisantes, arvejas, hongos, apio y Counsellor. Cebollas, ajo, puerros y la parte blanca de los cebollines. Granos Trigo, incluidos el kamut, trigo duro y [place]. Cebada y bulgur. Cuscs. Cereales a base de trigo. Fideos de trigo, pan, galletas saladas y pasteles. Carnes y otras protenas Carnes fritas o grasosas. Salchichas. Castaas de caj y pistachos. Soja, frijoles en salsa de tomate, frijoles negros, garbanzos, porotos colorados, habas, porotos  blancos, lentejas, frijoles pintos y arvejas partidas. Lcteos Leche, yogur, helado y queso blando. Crema y Optometrist. Salsas a base de [employer]. Natillas. Suero de [place]. La  leche de soja. Condimentos y otros alimentos Cualquier goma de Theatre manager o caramelo sin azcar. Alimentos que contienen endulzantes artificiales tales como el sorbitol, manitol, isomalt o xilitol. Alimentos que contienen miel, jarabe de maz de alta fructosa o agave. Consom, caldo de verduras, caldo de res y caldo de [place]. Ajo y cebolla en polvo. Condimentos hechos con cebolla, como el hummus, chutney, pickles, salsa de pepinillos, aderezo para ensaladas y [place]. Extracto de [place]. Bebidas Bebidas a base de endivias. Sustitutos del caf. T de manzanilla. T de hinojo. Vinos dulces o enriquecidos como el oporto o el Visual merchandiser. Refrescos dietticos hechos con isomalt, manitol, maltitol, sorbitol o xilitol. Jugo de [place], pera y mango. Jugos con Charolett Copes de maz de alta fructosa. Es posible que los productos que se enumeran ms [place] no constituyan una lista completa de los alimentos y las bebidas que Personnel officer. Consulte a un nutricionista para obtener ms informacin. Resumen FODMAP significa oligosacridos, disacridos, monosacridos y polioles fermentables. Son azcares difciles de digerir para International aid/development worker. Un plan de alimentacin con bajo contenido de FODMAP es una dieta a corto plazo que ayuda a Paramedic los sntomas de ciertas enfermedades intestinales. Generalmente, el plan de alimentacin dura hasta 6 semanas. A continuacin, los alimentos con alto contenido de FODMAP se reintroducen gradualmente y de a uno. Esto puede ayudarle a Financial risk analyst qu alimentos pueden estar causando los sntomas. Un plan de alimentacin con bajo contenido de FODMAP puede ser complicado. Es aconsejable consultar a un nutricionista con experiencia en este tipo de plan. Esta informacin no tiene Theme park manager el consejo del mdico. Asegrese de hacerle al mdico cualquier pregunta que tenga. Document Revised: 06/30/2019 Document Reviewed: 12/06/2022 Elsevier Patient Education  [employer].  Due to recent  changes in healthcare laws, you may see the results of your imaging and laboratory studies on MyChart before your provider has had a chance to review them.  We understand that in some cases there may be results that are confusing or concerning to you. Not all laboratory results come back in the same time frame and the provider may be waiting for multiple results in order to interpret others.  Please give us  48 hours in order for your provider to thoroughly review all the results before contacting the office for clarification of your results.    I appreciate the  opportunity to care for you  Thank You   Natividad Balding

## 2023-07-02 ENCOUNTER — Encounter: Payer: Self-pay | Admitting: Pediatrics

## 2023-07-12 ENCOUNTER — Encounter: Admitting: Pediatrics

## 2023-09-07 ENCOUNTER — Ambulatory Visit

## 2023-09-21 NOTE — Progress Notes (Deleted)
 [facility] History and Physical   Primary Care Physician:  Elicia Hamlet, MD   Reason for Procedure:  GERD, dysphagia, gas, colorectal cancer screening  Plan:    Upper endoscopy with possible dilation and screening colonoscopy     HPI: Tracey Kane is a 53 y.o. female undergoing upper endoscopy with possible dilation for evaluation of GERD, dysphagia and gas as well as colorectal cancer screening.  Patient reports a history of GERD for which she is currently taking omeprazole .  Endorses intermittent difficulty with swallowing particularly with rice and bread.  Symptoms of nausea when consuming beef.  Has had worsening symptoms of gas and bloating.  Due for colorectal cancer screening.  No prior colonoscopy.  No family history of colorectal cancer or polyps.  Patient has had symptoms of intermittent constipation managed with dietary modification.   Past Medical History:  Diagnosis Date   Asthma    GERD (gastroesophageal reflux disease)     Past Surgical History:  Procedure Laterality Date   KNEE SURGERY Left    LAPAROSCOPIC APPENDECTOMY N/A 04/02/2016   Procedure: APPENDECTOMY LAPAROSCOPIC;  Surgeon: Dann Hummer, MD;  Location: [facility] OR;  Service: General;  Laterality: N/A;    Prior to Admission medications   Medication Sig Start Date End Date Taking? Authorizing Provider  albuterol  (VENTOLIN  HFA) 108 (90 Base) MCG/ACT inhaler INHALE 1 TO 2 PUFFS INTO THE LUNGS EVERY 6 HOURS AS NEEDED FOR WHEEZING OR SHORTNESS OF BREATH 03/23/23   Elicia Hamlet, MD  azelastine  (ASTELIN ) 0.1 % nasal spray Place 1 spray into both nostrils 2 (two) times daily. Use in each nostril as directed Patient not taking: Reported on 06/08/2023 05/13/22   Jennelle Riis, MD  benzonatate  (TESSALON ) 200 MG capsule Take 200 mg by mouth 3 (three) times daily as needed. Patient not taking: Reported on 06/08/2023 11/02/21   [provider]  cetirizine  (ZYRTEC  ALLERGY) 10 MG tablet Take 1  tablet (10 mg total) by mouth daily. Patient not taking: Reported on 06/08/2023 01/01/20   Christopher Savannah, PA-C  chlorpheniramine-HYDROcodone  (TUSSIONEX) 10-8 MG/5ML Take 5 mLs by mouth every 12 (twelve) hours as needed for cough. Patient not taking: Reported on 06/08/2023 05/13/22   Prosperi, Christian H, PA-C  ciclopirox  (PENLAC ) 8 % solution Apply topically at bedtime. Apply over nail and surrounding skin. Apply daily over previous coat. After seven (7) days, may remove with alcohol and continue cycle. 03/23/23   Elicia Hamlet, MD  famotidine  (PEPCID ) 40 MG tablet Take 1 tablet (40 mg total) by mouth at bedtime. 06/08/23   Craig Alan SAUNDERS, PA-C  fluticasone  (FLONASE ) 50 MCG/ACT nasal spray Place 2 sprays into both nostrils daily. Patient not taking: Reported on 06/08/2023 05/13/22   Sowell, Brandon, MD  fluticasone -salmeterol (ADVAIR) 100-50 MCG/ACT AEPB Inhale 1 puff into the lungs 2 (two) times daily. Patient not taking: Reported on 06/08/2023 05/13/22   Sowell, Brandon, MD  levonorgestrel  (MIRENA , 52 MG,) 20 MCG/DAY IUD 1 each by Intrauterine route once. 08/19/20   [provider]  omeprazole  (PRILOSEC) 20 MG capsule Take 20 mg by mouth daily.    [provider]  polyethylene glycol powder (GLYCOLAX /MIRALAX ) 17 GM/SCOOP powder Take 17 g by mouth daily. Patient not taking: Reported on 06/08/2023 03/19/22   Austin Ade, MD  Turmeric (QC TUMERIC COMPLEX PO) Take by mouth.    [provider]  loratadine (CLARITIN) 10 MG tablet Take 10 mg by mouth daily as needed for allergies. Please print label in spanish   01/01/20  [provider]    Current Outpatient Medications  Medication Sig Dispense Refill   albuterol  (VENTOLIN  HFA) 108 (90 Base) MCG/ACT inhaler INHALE 1 TO 2 PUFFS INTO THE LUNGS EVERY 6 HOURS AS NEEDED FOR WHEEZING OR SHORTNESS OF BREATH 18 g 3   azelastine  (ASTELIN ) 0.1 % nasal spray Place 1 spray into both nostrils 2 (two) times daily. Use in each nostril as directed  (Patient not taking: Reported on 06/08/2023) 30 mL 0   benzonatate  (TESSALON ) 200 MG capsule Take 200 mg by mouth 3 (three) times daily as needed. (Patient not taking: Reported on 06/08/2023)     cetirizine  (ZYRTEC  ALLERGY) 10 MG tablet Take 1 tablet (10 mg total) by mouth daily. (Patient not taking: Reported on 06/08/2023) 30 tablet 0   chlorpheniramine-HYDROcodone  (TUSSIONEX) 10-8 MG/5ML Take 5 mLs by mouth every 12 (twelve) hours as needed for cough. (Patient not taking: Reported on 06/08/2023) 70 mL 0   ciclopirox  (PENLAC ) 8 % solution Apply topically at bedtime. Apply over nail and surrounding skin. Apply daily over previous coat. After seven (7) days, may remove with alcohol and continue cycle. 6.6 mL 0   famotidine  (PEPCID ) 40 MG tablet Take 1 tablet (40 mg total) by mouth at bedtime. 30 tablet 0   fluticasone  (FLONASE ) 50 MCG/ACT nasal spray Place 2 sprays into both nostrils daily. (Patient not taking: Reported on 06/08/2023) 16 g 6   fluticasone -salmeterol (ADVAIR) 100-50 MCG/ACT AEPB Inhale 1 puff into the lungs 2 (two) times daily. (Patient not taking: Reported on 06/08/2023) 1 each 3   levonorgestrel  (MIRENA , 52 MG,) 20 MCG/DAY IUD 1 each by Intrauterine route once.     omeprazole  (PRILOSEC) 20 MG capsule Take 20 mg by mouth daily.     polyethylene glycol powder (GLYCOLAX /MIRALAX ) 17 GM/SCOOP powder Take 17 g by mouth daily. (Patient not taking: Reported on 06/08/2023) 500 g 0   Turmeric (QC TUMERIC COMPLEX PO) Take by mouth.     Current Facility-Administered Medications  Medication Dose Route Frequency Provider Last Rate Last Admin   aspirin  tablet 325 mg  325 mg Oral Once Sowell, Brandon, MD        Allergies as of 09/24/2023 - Review Complete 06/08/2023  Allergen Reaction Noted   Azithromycin   01/03/2010   Norgestimate-eth estradiol  07/12/2009    Family History  Problem Relation Age of Onset   Heart disease Father    Hyperlipidemia Father    Hypertension Father    Diabetes Father     Diabetes Sister    Hypertension Sister    Heart Problems Son     Social History   Socioeconomic History   Marital status: Single    Spouse name: Not on file   Number of children: 3   Years of education: Not on file   Highest education level: Not on file  Occupational History   Not on file  Tobacco Use   Smoking status: Never   Smokeless tobacco: Never  Vaping Use   Vaping status: Never Used  Substance and Sexual Activity   Alcohol use: No   Drug use: No   Sexual activity: Yes    Birth control/protection: I.U.D.  Other Topics Concern   Not on file  Social History Narrative   ** Merged History Encounter **       Works in housekeeping at [employer]   Husband- comes home 1- 2 x per month- Education administrator.    3 children and 1 grandchild lives at home with her.  Social Drivers of Corporate investment banker Strain: Not on file  Food Insecurity: Not on file  Transportation Needs: Not on file  Physical Activity: Not on file  Stress: Not on file  Social Connections: Not on file  Intimate Partner Violence: Not on file    Review of Systems:  All other review of systems negative except as mentioned in the HPI.  Physical Exam: Vital signs There were no vitals taken for this visit.  General:   Alert,  Well-developed, well-nourished, pleasant and cooperative in NAD Airway:  Mallampati  Lungs:  Clear throughout to auscultation.   Heart:  Regular rate and rhythm; no murmurs, clicks, rubs,  or gallops. Abdomen:  Soft, nontender and nondistended. Normal bowel sounds.   Neuro/Psych:  Normal mood and affect. A and O x 3  Inocente Hausen, MD [facility]

## 2023-09-24 ENCOUNTER — Encounter: Admitting: Pediatrics

## 2023-10-22 ENCOUNTER — Telehealth: Payer: Self-pay

## 2023-10-22 ENCOUNTER — Encounter

## 2023-10-22 NOTE — Telephone Encounter (Signed)
 Patient No Showed for Pre-Visit. I called the patient 10 minutes after she was due. I spoke with the patients daughter Shanda and she informed me that the patient had Covid and was not able to make the appointment. Shanda states that her mother would like for me to cancel PV and her double procedure that is scheduled for 11/05/23 and she would call back to reschedule when she was feeling better. Shanda states that when the appointment reminder called she pressed NO that she couldn't keep her appointment. I did not receive that information.   Inocente Shams, LPN ( [facility] )

## 2023-11-05 ENCOUNTER — Encounter: Admitting: Pediatrics

## 2023-12-20 ENCOUNTER — Other Ambulatory Visit: Payer: Self-pay | Admitting: Family Medicine

## 2023-12-20 DIAGNOSIS — J45909 Unspecified asthma, uncomplicated: Secondary | ICD-10-CM
